# Patient Record
Sex: Male | Born: 1975 | Race: Black or African American | Hispanic: No | Marital: Single | Smoking: Current some day smoker
Health system: Southern US, Community
[De-identification: ages and names within clinical notes are randomized; demographics above are authoritative.]

## PROBLEM LIST (undated history)

## (undated) HISTORY — PX: CYSTOSCOPY: SUR368

---

## 2012-01-04 ENCOUNTER — Ambulatory Visit (INDEPENDENT_AMBULATORY_CARE_PROVIDER_SITE_OTHER): Payer: Managed Care, Other (non HMO) | Admitting: Family Medicine

## 2012-01-04 ENCOUNTER — Ambulatory Visit (HOSPITAL_BASED_OUTPATIENT_CLINIC_OR_DEPARTMENT_OTHER)
Admission: RE | Admit: 2012-01-04 | Discharge: 2012-01-04 | Disposition: A | Discharge status: Home / Self Care | Payer: Managed Care, Other (non HMO) | Source: Ambulatory Visit | Attending: Family Medicine | Admitting: Family Medicine

## 2012-01-04 ENCOUNTER — Encounter: Payer: Self-pay | Admitting: Family Medicine

## 2012-01-04 VITALS — BP 120/68 | HR 86 | Temp 98.0°F | Wt 185.0 lb

## 2012-01-04 DIAGNOSIS — M545 Low back pain, unspecified: Secondary | ICD-10-CM | POA: Insufficient documentation

## 2012-01-04 DIAGNOSIS — M5137 Other intervertebral disc degeneration, lumbosacral region: Secondary | ICD-10-CM | POA: Insufficient documentation

## 2012-01-04 DIAGNOSIS — M549 Dorsalgia, unspecified: Secondary | ICD-10-CM

## 2012-01-04 DIAGNOSIS — M51379 Other intervertebral disc degeneration, lumbosacral region without mention of lumbar back pain or lower extremity pain: Secondary | ICD-10-CM | POA: Insufficient documentation

## 2012-01-04 MED ORDER — HYDROCODONE-ACETAMINOPHEN 7.5-750 MG PO TABS: 1.0000 | ORAL_TABLET | Freq: Three times a day (TID) | ORAL | Status: DC | PRN

## 2012-01-04 MED ORDER — CYCLOBENZAPRINE HCL 10 MG PO TABS: 10.0000 mg | ORAL_TABLET | Freq: Three times a day (TID) | ORAL | Status: AC | PRN

## 2012-01-04 NOTE — Progress Notes (Signed)
  Subjective:    Colin Black is a 36 y.o. male who presents with right hip pain. Onset of the symptoms was about 4 weeks ago. Inciting event: none. The patient reports the hip pain is worse after period of inactivity and radiates to R ankle. Aggravating symptoms include: any weight bearing, rising after sitting, standing and walking. Patient has had no prior hip problems. Previous visits for this problem: none. Evaluation to date: none. Treatment to date: none.      Review of Systems Pertinent items are noted in HPI.   Objective:    BP 120/68  Pulse 86  Temp 98 F (36.7 C) (Oral)  Wt 185 lb (83.915 kg)  SpO2 98% Right hip: positives: decreased abduction, decreased flexion and tenderness over greater trochanter and negatives: + SLR R leg ,  dec DTR R patellar  Left hip: normal   Imaging: X-ray LS spine: not available    Assessment:    R hip pain-- suspect Lumbar etiology    Plan:    Natural history and expected course discussed. Questions answered. Transport planner distributed. X-rays per orders. flexeril and vicodin  Ice / heat rest

## 2012-01-04 NOTE — Patient Instructions (Addendum)
Back Pain, Adult Low back pain is very common. About 1 in 5 people have back pain.The cause of low back pain is rarely dangerous. The pain often gets better over time.About half of people with a sudden onset of back pain feel better in just 2 weeks. About 8 in 10 people feel better by 6 weeks.  CAUSES Some common causes of back pain include:  Strain of the muscles or ligaments supporting the spine.   Wear and tear (degeneration) of the spinal discs.   Arthritis.   Direct injury to the back.  DIAGNOSIS Most of the time, the direct cause of low back pain is not known.However, back pain can be treated effectively even when the exact cause of the pain is unknown.Answering your caregiver's questions about your overall health and symptoms is one of the most accurate ways to make sure the cause of your pain is not dangerous. If your caregiver needs more information, he or she may order lab work or imaging tests (X-rays or MRIs).However, even if imaging tests show changes in your back, this usually does not require surgery. HOME CARE INSTRUCTIONS For many people, back pain returns.Since low back pain is rarely dangerous, it is often a condition that people can learn to manageon their own.   Remain active. It is stressful on the back to sit or stand in one place. Do not sit, drive, or stand in one place for more than 30 minutes at a time. Take short walks on level surfaces as soon as pain allows.Try to increase the length of time you walk each day.   Do not stay in bed.Resting more than 1 or 2 days can delay your recovery.   Do not avoid exercise or work.Your body is made to move.It is not dangerous to be active, even though your back may hurt.Your back will likely heal faster if you return to being active before your pain is gone.   Pay attention to your body when you bend and lift. Many people have less discomfortwhen lifting if they bend their knees, keep the load close to their  bodies,and avoid twisting. Often, the most comfortable positions are those that put less stress on your recovering back.   Find a comfortable position to sleep. Use a firm mattress and lie on your side with your knees slightly bent. If you lie on your back, put a pillow under your knees.   Only take over-the-counter or prescription medicines as directed by your caregiver. Over-the-counter medicines to reduce pain and inflammation are often the most helpful.Your caregiver may prescribe muscle relaxant drugs.These medicines help dull your pain so you can more quickly return to your normal activities and healthy exercise.   Put ice on the injured area.   Put ice in a plastic bag.   Place a towel between your skin and the bag.   Leave the ice on for 15 to 20 minutes, 3 to 4 times a day for the first 2 to 3 days. After that, ice and heat may be alternated to reduce pain and spasms.   Ask your caregiver about trying back exercises and gentle massage. This may be of some benefit.   Avoid feeling anxious or stressed.Stress increases muscle tension and can worsen back pain.It is important to recognize when you are anxious or stressed and learn ways to manage it.Exercise is a great option.  SEEK MEDICAL CARE IF:  You have pain that is not relieved with rest or medicine.   You have   pain that does not improve in 1 week.   You have new symptoms.   You are generally not feeling well.  SEEK IMMEDIATE MEDICAL CARE IF:   You have pain that radiates from your back into your legs.   You develop new bowel or bladder control problems.   You have unusual weakness or numbness in your arms or legs.   You develop nausea or vomiting.   You develop abdominal pain.   You feel faint.  Document Released: 05/17/2005 Document Revised: 05/06/2011 Document Reviewed: 10/05/2010 [employer] Patient Information 2012 [employer]. 

## 2012-01-07 ENCOUNTER — Telehealth: Payer: Self-pay

## 2012-01-07 DIAGNOSIS — M5136 Other intervertebral disc degeneration, lumbar region: Secondary | ICD-10-CM

## 2012-01-07 DIAGNOSIS — M549 Dorsalgia, unspecified: Secondary | ICD-10-CM

## 2012-01-07 NOTE — Telephone Encounter (Signed)
Call from patient and he stated he wanted to proceed with the Physical Therapy due to no improvement in the back pain-- referral put in    KP

## 2012-01-14 ENCOUNTER — Telehealth: Payer: Self-pay

## 2012-01-14 DIAGNOSIS — M549 Dorsalgia, unspecified: Secondary | ICD-10-CM

## 2012-01-14 MED ORDER — HYDROCODONE-ACETAMINOPHEN 7.5-750 MG PO TABS: 1.0000 | ORAL_TABLET | Freq: Three times a day (TID) | ORAL | Status: AC | PRN

## 2012-01-14 NOTE — Telephone Encounter (Signed)
Which med?  If hydrocodone, ok for #30, no refills.  Any additional meds will require appt

## 2012-01-14 NOTE — Telephone Encounter (Signed)
Last seen and filled 01/04/12 # 30. Please advise     KP

## 2012-01-18 ENCOUNTER — Ambulatory Visit: Payer: Self-pay | Admitting: Family Medicine

## 2012-01-25 ENCOUNTER — Ambulatory Visit: Payer: Managed Care, Other (non HMO) | Attending: Family Medicine | Admitting: Physical Therapy

## 2012-01-25 DIAGNOSIS — IMO0001 Reserved for inherently not codable concepts without codable children: Secondary | ICD-10-CM | POA: Insufficient documentation

## 2012-01-25 DIAGNOSIS — M2569 Stiffness of other specified joint, not elsewhere classified: Secondary | ICD-10-CM | POA: Insufficient documentation

## 2012-01-25 DIAGNOSIS — M545 Low back pain, unspecified: Secondary | ICD-10-CM | POA: Insufficient documentation

## 2012-02-08 ENCOUNTER — Ambulatory Visit: Payer: Managed Care, Other (non HMO) | Attending: Family Medicine | Admitting: Physical Therapy

## 2012-02-08 DIAGNOSIS — M545 Low back pain, unspecified: Secondary | ICD-10-CM | POA: Insufficient documentation

## 2012-02-08 DIAGNOSIS — IMO0001 Reserved for inherently not codable concepts without codable children: Secondary | ICD-10-CM | POA: Insufficient documentation

## 2012-02-08 DIAGNOSIS — M2569 Stiffness of other specified joint, not elsewhere classified: Secondary | ICD-10-CM | POA: Insufficient documentation

## 2012-02-15 ENCOUNTER — Ambulatory Visit: Payer: Managed Care, Other (non HMO) | Admitting: Physical Therapy

## 2012-02-22 ENCOUNTER — Ambulatory Visit: Payer: Managed Care, Other (non HMO) | Admitting: Physical Therapy

## 2012-02-29 ENCOUNTER — Ambulatory Visit: Payer: Managed Care, Other (non HMO) | Attending: Family Medicine | Admitting: Physical Therapy

## 2012-02-29 DIAGNOSIS — IMO0001 Reserved for inherently not codable concepts without codable children: Secondary | ICD-10-CM | POA: Insufficient documentation

## 2012-02-29 DIAGNOSIS — M545 Low back pain, unspecified: Secondary | ICD-10-CM | POA: Insufficient documentation

## 2012-02-29 DIAGNOSIS — M2569 Stiffness of other specified joint, not elsewhere classified: Secondary | ICD-10-CM | POA: Insufficient documentation

## 2012-03-07 ENCOUNTER — Ambulatory Visit: Payer: Managed Care, Other (non HMO) | Admitting: Physical Therapy

## 2012-03-13 ENCOUNTER — Other Ambulatory Visit: Payer: Self-pay

## 2012-03-13 MED ORDER — HYDROCODONE-ACETAMINOPHEN 7.5-750 MG PO TABS: 1.0000 | ORAL_TABLET | Freq: Four times a day (QID) | ORAL | Status: DC | PRN

## 2012-03-13 NOTE — Telephone Encounter (Signed)
Last seen 01/04/12 and filled #30 no refills.  PLz advise   MW

## 2012-03-13 NOTE — Telephone Encounter (Signed)
Ok to refill x1 but then he will need ov if no better

## 2012-03-14 ENCOUNTER — Encounter: Payer: Managed Care, Other (non HMO) | Admitting: Physical Therapy

## 2012-03-14 ENCOUNTER — Ambulatory Visit: Payer: Managed Care, Other (non HMO) | Admitting: Physical Therapy

## 2012-03-21 ENCOUNTER — Encounter: Payer: Managed Care, Other (non HMO) | Admitting: Physical Therapy

## 2012-03-21 ENCOUNTER — Ambulatory Visit: Payer: Managed Care, Other (non HMO) | Admitting: Physical Therapy

## 2012-03-28 ENCOUNTER — Encounter: Payer: Managed Care, Other (non HMO) | Admitting: Physical Therapy

## 2012-05-09 ENCOUNTER — Telehealth: Payer: Self-pay

## 2012-05-09 ENCOUNTER — Encounter: Payer: Self-pay | Admitting: Family Medicine

## 2012-05-09 DIAGNOSIS — M549 Dorsalgia, unspecified: Secondary | ICD-10-CM | POA: Insufficient documentation

## 2012-05-09 NOTE — Telephone Encounter (Signed)
Patient aware paper work ready for pick up.       KP 

## 2012-05-09 NOTE — Telephone Encounter (Signed)
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  I;m catching up this week.

## 2012-05-09 NOTE — Telephone Encounter (Signed)
Patient dropped off paperwork while you were out and he is calling to check the status. Please advise     KP

## 2012-06-22 ENCOUNTER — Encounter: Payer: Self-pay | Admitting: Lab

## 2012-06-23 ENCOUNTER — Encounter: Payer: Self-pay | Admitting: Family Medicine

## 2012-06-23 ENCOUNTER — Ambulatory Visit (INDEPENDENT_AMBULATORY_CARE_PROVIDER_SITE_OTHER): Payer: Managed Care, Other (non HMO) | Admitting: Family Medicine

## 2012-06-23 VITALS — BP 110/64 | HR 105 | Temp 98.4°F | Wt 195.2 lb

## 2012-06-23 DIAGNOSIS — R350 Frequency of micturition: Secondary | ICD-10-CM

## 2012-06-23 LAB — POCT URINALYSIS DIPSTICK
Blood, UA: NEGATIVE
Nitrite, UA: NEGATIVE
Protein, UA: NEGATIVE
Spec Grav, UA: 1.015
Urobilinogen, UA: >=8

## 2012-06-23 NOTE — Patient Instructions (Signed)
Urinary Frequency The number of times a normal person urinates depends upon how much liquid they take in and how much liquid they are losing. If the temperature is hot and there is high humidity then the person will sweat more and usually breathe a little more frequently. These factors decrease the amount of frequency of urination that would be considered normal. The amount you drink is easily determined, but the amount of fluid lost is sometimes more difficult to calculate.  Fluid is lost in two ways:  Sensible fluid loss is usually measured by the amount of urine that you get rid of. Losses of fluid can also occur with diarrhea.  Insensible fluid loss is more difficult to measure. It is caused by evaporation. Insensible loss of fluid occurs through breathing and sweating. It usually ranges from a little less than a quart to a little more than a quart of fluid a day. In normal temperatures and activity levels the average person may urinate 4 to 7 times in a 24-hour period. Needing to urinate more often than that could indicate a problem. If one urinates 4 to 7 times in 24 hours and has large volumes each time, that could indicate a different problem from one who urinates 4 to 7 times a day and has small volumes. The time of urinating is also an important. Most urinating should be done during the waking hours. Getting up at night to urinate frequently can indicate some problems. CAUSES  The bladder is the organ in your lower abdomen that holds urine. Like a balloon, it swells some as it fills up. Your nerves sense this and tell you it is time to head for the bathroom. There are a number of reasons that you might feel the need to urinate more often than usual. They include:  Urinary tract infection. This is usually associated with other signs such as burning when you urinate.  In men, problems with the prostate (a walnut-size gland that is located near the tube that carries urine out of your body).  There are two reasons why the prostate can cause an increased frequency of urination:  An enlarged prostate that does not let the bladder empty well. If the bladder only half empties when you urinate then it only has half the capacity to fill before you have to urinate again.  The nerves in the bladder become more hypersensitive with an increased size of the prostate even if the bladder empties completely.  Pregnancy.  Obesity. Excess weight is more likely to cause a problem for women more than for men.  Bladder stones or other bladder problems.  Caffeine.  Alcohol.  Medications. For example, drugs that help the body get rid of extra fluid (diuretics) increase urine production. Some other medicines must be taken with lots of fluids.  Muscle or nerve weakness. This might be the result of a spinal cord injury, a stroke, multiple sclerosis or Parkinson's disease.  Long-standing diabetes can decrease the sensation of the bladder. This loss of sensation makes it harder to sense the bladder needs to be emptied. Over a period of years the bladder is stretched out by constant overfilling. This weakens the bladder muscles so that the bladder does not empty well and has less capacity to fill with new urine.  Interstitial cystitis (also called painful bladder syndrome). This condition develops because the tissues that line the insider of the bladder are inflamed (inflammation is the body's way of reacting to injury or infection). It causes pain  the insider of the bladder are inflamed (inflammation is the body's way of reacting to injury or infection). It causes pain and frequent urination. It occurs in women more often than in men.  DIAGNOSIS    To decide what might be causing your urinary frequency, your healthcare provider will probably:   Ask about symptoms you have noticed.   Ask about your overall health. This will include questions about any medications you are taking.   Do a physical examination.   Order some tests. These might include:   A blood test to check for diabetes or other health issues that could be  contributing to the problem.   Urine testing. This could measure the flow of urine and the pressure on the bladder.   A test of your neurological system (the brain, spinal cord and nerves). This is the system that senses the need to urinate.   A bladder test to check whether it is emptying completely when you urinate.   Cytoscopy. This test uses a thin tube with a tiny camera on it. It offers a look inside your urethra and bladder to see if there are problems.   Imaging tests. You might be given a contrast dye and then asked to urinate. X-rays are taken to see how your bladder is working.  TREATMENT   It is important for you to be evaluated to determine if the amount or frequency that you have is unusual or abnormal. If it is found to be abnormal the cause should be determined and this can usually be found out easily. Depending upon the cause treatment could include medication, stimulation of the nerves, or surgery.  There are not too many things that you can do as an individual to change your urinary frequency. It is important that you balance the amount of fluid intake needed to compensate for your activity and the temperature. Medical problems will be diagnosed and taken care of by your physician. There is no particular bladder training such as Kegel's exercises that you can do to help urinary frequency. This is an exercise this is usually done for people who have leaking of urine when they laugh cough or sneeze.  HOME CARE INSTRUCTIONS    Take any medications your healthcare provider prescribed or suggested. Follow the directions carefully.   Practice any lifestyle changes that are recommended. These might include:   Drinking less fluid or drinking at different times of the day. If you need to urinate often during the night, for example, you may need to stop drinking fluids early in the evening.   Cutting down on caffeine or alcohol. They both can make you need to urinate more often than normal.  Caffeine is found in coffee, tea and sodas.   Losing weight, if that is recommended.   Keep a journal or a log. You might be asked to record how much you drink and when and when you feel the need to urinate. This will also help evaluate how well the treatment provided by your physician is working.  SEEK MEDICAL CARE IF:    Your need to urinate often gets worse.   You feel increased pain or irritation when you urinate.   You notice blood in your urine.   You have questions about any medications that your healthcare provider recommended.   You notice blood, pus or swelling at the site of any test or treatment procedure.   You develop a fever of more than 100.5 F (38.1 C).  SEEK

## 2012-06-23 NOTE — Progress Notes (Signed)
  Subjective:    Colin Black is a 37 y.o. male who complains of frequency. He has had symptoms for 1 month. Patient also complains of none. Patient denies back pain, congestion, cough, fever, headache, rhinitis, sorethroat, stomach ache and vaginal discharge. Patient does have a history of recurrent UTI. Patient does have a history of pyelonephritis. Pt saw urology and had a cytoscopy about 5 years ago.    The following portions of the patient's history were reviewed and updated as appropriate: allergies, current medications, past family history, past medical history, past social history, past surgical history and problem list.  Review of Systems Pertinent items are noted in HPI.    Objective:    BP 110/64  Pulse 105  Temp 98.4 F (36.9 C) (Oral)  Wt 195 lb 3.2 oz (88.542 kg)  SpO2 98% General appearance: alert, cooperative, appears stated age and no distress  Laboratory:  Urine dipstick: trace for leukocyte esterase.   Micro exam: not done.    Assessment:    urinary frequency     Plan:    Medications: not indicated at this time. Maintain adequate hydration. Follow up if symptoms not improving, and as needed.

## 2012-06-25 LAB — URINE CULTURE: Colony Count: NO GROWTH

## 2012-07-15 ENCOUNTER — Other Ambulatory Visit: Payer: Self-pay

## 2012-09-06 ENCOUNTER — Telehealth: Payer: Self-pay

## 2012-09-06 MED ORDER — NICOTINE 21 MG/24HR TD PT24: 1.0000 | MEDICATED_PATCH | TRANSDERMAL | Status: DC

## 2012-09-06 NOTE — Telephone Encounter (Signed)
Its otc--- he doesn't need an rx  Start with 21 mg for at least 6 weeks Then decrease to 14 for 2 weeks (or longer if needed)  Then 7 mg for 2 weeks

## 2012-09-06 NOTE — Telephone Encounter (Signed)
Call from patient requesting an Rx for Nicoderm CQ, he said he is ready to quit smoking. Please advise     KP

## 2012-09-06 NOTE — Telephone Encounter (Addendum)
Patient is using his flexible spending account, Rx for the 21 mg sent to [facility] on [address]. He will call in 6 weeks for the 14 mg.      KP

## 2012-10-30 ENCOUNTER — Telehealth: Payer: Self-pay

## 2012-10-30 MED ORDER — HYDROCODONE-ACETAMINOPHEN 7.5-750 MG PO TABS: 1.0000 | ORAL_TABLET | Freq: Four times a day (QID) | ORAL | Status: DC | PRN

## 2012-10-30 NOTE — Telephone Encounter (Signed)
Refill x1 

## 2012-10-30 NOTE — Telephone Encounter (Signed)
Last seen 06/23/12 and filled 03/13/12 #30. Patient takes Prn.  Please advise    KP

## 2013-01-18 ENCOUNTER — Emergency Department (HOSPITAL_COMMUNITY)
Admission: EM | Admit: 2013-01-18 | Discharge: 2013-01-19 | Disposition: A | Discharge status: Home / Self Care | Payer: Managed Care, Other (non HMO) | Attending: Emergency Medicine | Admitting: Emergency Medicine

## 2013-01-18 ENCOUNTER — Encounter (HOSPITAL_COMMUNITY): Payer: Self-pay | Admitting: Emergency Medicine

## 2013-01-18 DIAGNOSIS — S0501XA Injury of conjunctiva and corneal abrasion without foreign body, right eye, initial encounter: Secondary | ICD-10-CM

## 2013-01-18 DIAGNOSIS — S058X9A Other injuries of unspecified eye and orbit, initial encounter: Secondary | ICD-10-CM | POA: Insufficient documentation

## 2013-01-18 DIAGNOSIS — Y9389 Activity, other specified: Secondary | ICD-10-CM | POA: Insufficient documentation

## 2013-01-18 DIAGNOSIS — IMO0002 Reserved for concepts with insufficient information to code with codable children: Secondary | ICD-10-CM | POA: Insufficient documentation

## 2013-01-18 DIAGNOSIS — Z79899 Other long term (current) drug therapy: Secondary | ICD-10-CM | POA: Insufficient documentation

## 2013-01-18 DIAGNOSIS — Y990 Civilian activity done for income or pay: Secondary | ICD-10-CM | POA: Insufficient documentation

## 2013-01-18 DIAGNOSIS — F17200 Nicotine dependence, unspecified, uncomplicated: Secondary | ICD-10-CM | POA: Insufficient documentation

## 2013-01-18 DIAGNOSIS — Y929 Unspecified place or not applicable: Secondary | ICD-10-CM | POA: Insufficient documentation

## 2013-01-18 NOTE — ED Notes (Addendum)
Pt's right eye is very irritated and watery. Pt reports pain in his right eye especially when he raises his eye lid. Pt states he flushed his right eye sixty times and did not get any relief. Pt states the air conditioner in the car really hurt and irriatated his right eye.

## 2013-01-18 NOTE — ED Notes (Signed)
PT. REPORTS METAL CHIP HIT HIS RIGHT EYE WHILE RESURFACING A BRAKE ROTOR TODAY , PRESENTS WITH RED TEARY RIGHT EYE. NO BLURRED VISION .

## 2013-01-19 MED ORDER — FLUORESCEIN SODIUM 1 MG OP STRP
1.0000 | ORAL_STRIP | Freq: Once | OPHTHALMIC | Status: AC
  Administered 2013-01-19: 1 via OPHTHALMIC
  Filled 2013-01-19: qty 1

## 2013-01-19 MED ORDER — TETRACAINE HCL 0.5 % OP SOLN
1.0000 [drp] | Freq: Once | OPHTHALMIC | Status: AC
  Administered 2013-01-19: 1 [drp] via OPHTHALMIC
  Filled 2013-01-19: qty 2

## 2013-01-19 MED ORDER — HYDROCODONE-ACETAMINOPHEN 5-325 MG PO TABS: 1.0000 | ORAL_TABLET | ORAL | Status: DC | PRN

## 2013-01-19 MED ORDER — ERYTHROMYCIN 5 MG/GM OP OINT: TOPICAL_OINTMENT | OPHTHALMIC | Status: DC

## 2013-01-19 NOTE — ED Provider Notes (Signed)
  CSN: [id number]     Arrival date & time 01/18/13  2227 History     None    Chief Complaint  Patient presents with  . Foreign Body in Eye   (Consider location/radiation/quality/duration/timing/severity/associated sxs/prior Treatment) HPI History provided by pt.   Pt presents w/ right eye pain and foreign body sensation.  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  Flushed eye 6 times w/out relief. Symptoms aggravated by blinking and associated w/ lacrimation.  Denies blurred vision, drainage and fever.  Does not wear corrective lenses. Tetanus up to date. History reviewed. No pertinent past medical history. Past Surgical History  Procedure Laterality Date  . Cystoscopy      remove scar tissue from urinary tract   Family History  Problem Relation Age of Onset  . Arthritis Maternal Grandfather   . Prostate cancer Maternal Grandfather   . Hypertension Mother   . Hypertension Maternal Grandmother   . Diabetes Father   . Diabetes Maternal Grandmother   . Diabetes Maternal Uncle    History  Substance Use Topics  . Smoking status: Current Every Day Smoker  . Smokeless tobacco: Not on file  . Alcohol Use: Yes    Review of Systems  All other systems reviewed and are negative.    Allergies  Review of patient's allergies indicates no known allergies.  Home Medications   Current Outpatient Rx  Name  Route  Sig  Dispense  Refill  . Multiple Vitamin (MULTIVITAMIN) tablet   Oral   Take 1 tablet by mouth daily.         . Naproxen Sodium (ALEVE PO)   Oral   Take 1 tablet by mouth daily as needed (pain).         Marland Kitchen erythromycin ophthalmic ointment      Place a 1/2 inch ribbon of ointment into the lower eyelid.   1 g   0   . HYDROcodone-acetaminophen (NORCO/VICODIN) 5-325 MG per tablet   Oral   Take 1 tablet by mouth every 4 (four) hours as needed for pain.   12 tablet   0    BP 131/92  Pulse 72  Temp(Src) 97.1 F (36.2 C)  (Oral)  Resp 17  SpO2 98% Physical Exam  Nursing note and vitals reviewed. Constitutional: He is oriented to person, place, and time. He appears well-developed and well-nourished. No distress.  HENT:  Head: Normocephalic and atraumatic.  Nml conjunctiva on R.  PERRL.  EOMi.  Pinpoint corneal abrasion at 7 o-clock is visible w/ naked eye.  No other visible abrasions when stained w/ fluorescein and visualized under black light.  No foreign body under eyelids.  Eyes:  Normal appearance  Neck: Normal range of motion.  Pulmonary/Chest: Effort normal.  Musculoskeletal: Normal range of motion.  Neurological: He is alert and oriented to person, place, and time.  Psychiatric: He has a normal mood and affect. His behavior is normal.    ED Course   Procedures (including critical care time)  Labs Reviewed - No data to display No results found. 1. Corneal abrasion, right, initial encounter     MDM  37yo M presents w/ foreign body sensation R eye.  Exam consistent w/ corneal abrasion.  He does not wear corrective lenses.  Tetanus up to date. Prescribed erythromycin ointment and vicodin.  Return precautions discussed.   Otilio Miu, PA-C 01/19/13 [phone]

## 2013-01-20 NOTE — ED Provider Notes (Signed)
Medical screening examination/treatment/procedure(s) were performed by non-physician practitioner and as supervising physician I was immediately available for consultation/collaboration.   Gavin Pound. Sharlyn Odonnel, MD 01/20/13 1610

## 2013-03-21 ENCOUNTER — Encounter: Payer: Self-pay | Admitting: Family Medicine

## 2013-03-21 ENCOUNTER — Ambulatory Visit (INDEPENDENT_AMBULATORY_CARE_PROVIDER_SITE_OTHER): Payer: Managed Care, Other (non HMO) | Admitting: Family Medicine

## 2013-03-21 VITALS — BP 134/82 | HR 81 | Temp 98.5°F | Wt 203.0 lb

## 2013-03-21 DIAGNOSIS — R350 Frequency of micturition: Secondary | ICD-10-CM

## 2013-03-21 LAB — POCT URINALYSIS DIPSTICK
Glucose, UA: NEGATIVE
Ketones, UA: NEGATIVE
Protein, UA: NEGATIVE
Spec Grav, UA: 1.01

## 2013-03-21 NOTE — Progress Notes (Signed)
  Subjective:    Colin Black is a 37 y.o. male who complains of frequency. He has had symptoms for several days. Patient also complains of more frequent cath. Patient denies back pain, congestion, cough, fever, headache, rhinitis, sorethroat and stomach ache. Patient does not have a history of recurrent UTI. Patient does not have a history of pyelonephritis. 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    The following portions of the patient's history were reviewed and updated as appropriate: allergies, current medications, past family history, past medical history, past social history, past surgical history and problem list.  Review of Systems Pertinent items are noted in HPI.    Objective:    BP 134/82  Pulse 81  Temp(Src) 98.5 F (36.9 C) (Oral)  Wt 203 lb (92.08 kg)  SpO2 98% General appearance: alert, cooperative, appears stated age and no distress Abdomen: soft, non-tender; bowel sounds normal; no masses,  no organomegaly  Laboratory:  Urine dipstick: trace for leukocyte esterase.  --all else neg Micro exam: not done.    Assessment:    urinary frequency     Plan:    Maintain adequate hydration. Follow up if symptoms not improving, and as needed. refer to urology

## 2013-03-21 NOTE — Patient Instructions (Signed)
Urinary Frequency °The number of times a normal person urinates depends upon how much liquid they take in and how much liquid they are losing. If the temperature is hot and there is high humidity then the person will sweat more and usually breathe a little more frequently. These factors decrease the amount of frequency of urination that would be considered normal. °The amount you drink is easily determined, but the amount of fluid lost is sometimes more difficult to calculate.  °Fluid is lost in two ways: °· Sensible fluid loss is usually measured by the amount of urine that you get rid of. Losses of fluid can also occur with diarrhea. °· Insensible fluid loss is more difficult to measure. It is caused by evaporation. Insensible loss of fluid occurs through breathing and sweating. It usually ranges from a little less than a quart to a little more than a quart of fluid a day. °In normal temperatures and activity levels the average person may urinate 4 to 7 times in a 24-hour period. Needing to urinate more often than that could indicate a problem. If one urinates 4 to 7 times in 24 hours and has large volumes each time, that could indicate a different problem from one who urinates 4 to 7 times a day and has small volumes. The time of urinating is also an important. Most urinating should be done during the waking hours. Getting up at night to urinate frequently can indicate some problems. °CAUSES  °The bladder is the organ in your lower abdomen that holds urine. Like a balloon, it swells some as it fills up. Your nerves sense this and tell you it is time to head for the bathroom. There are a number of reasons that you might feel the need to urinate more often than usual. They include: °· Urinary tract infection. This is usually associated with other signs such as burning when you urinate. °· In men, problems with the prostate (a walnut-size gland that is located near the tube that carries urine out of your body).  There are two reasons why the prostate can cause an increased frequency of urination: °· An enlarged prostate that does not let the bladder empty well. If the bladder only half empties when you urinate then it only has half the capacity to fill before you have to urinate again. °· The nerves in the bladder become more hypersensitive with an increased size of the prostate even if the bladder empties completely. °· Pregnancy. °· Obesity. Excess weight is more likely to cause a problem for women more than for men. °· Bladder stones or other bladder problems. °· Caffeine. °· Alcohol. °· Medications. For example, drugs that help the body get rid of extra fluid (diuretics) increase urine production. Some other medicines must be taken with lots of fluids. °· Muscle or nerve weakness. This might be the result of a spinal cord injury, a stroke, multiple sclerosis or Parkinson's disease. °· Long-standing diabetes can decrease the sensation of the bladder. This loss of sensation makes it harder to sense the bladder needs to be emptied. Over a period of years the bladder is stretched out by constant overfilling. This weakens the bladder muscles so that the bladder does not empty well and has less capacity to fill with new urine. °· Interstitial cystitis (also called painful bladder syndrome). This condition develops because the tissues that line the insider of the bladder are inflamed (inflammation is the body's way of reacting to injury or infection). It causes pain   and frequent urination. It occurs in women more often than in men. °DIAGNOSIS  °· To decide what might be causing your urinary frequency, your healthcare provider will probably: °· Ask about symptoms you have noticed. °· Ask about your overall health. This will include questions about any medications you are taking. °· Do a physical examination. °· Order some tests. These might include: °· A blood test to check for diabetes or other health issues that could be  contributing to the problem. °· Urine testing. This could measure the flow of urine and the pressure on the bladder. °· A test of your neurological system (the brain, spinal cord and nerves). This is the system that senses the need to urinate. °· A bladder test to check whether it is emptying completely when you urinate. °· Cytoscopy. This test uses a thin tube with a tiny camera on it. It offers a look inside your urethra and bladder to see if there are problems. °· Imaging tests. You might be given a contrast dye and then asked to urinate. X-rays are taken to see how your bladder is working. °TREATMENT  °It is important for you to be evaluated to determine if the amount or frequency that you have is unusual or abnormal. If it is found to be abnormal the cause should be determined and this can usually be found out easily. Depending upon the cause treatment could include medication, stimulation of the nerves, or surgery. °There are not too many things that you can do as an individual to change your urinary frequency. It is important that you balance the amount of fluid intake needed to compensate for your activity and the temperature. Medical problems will be diagnosed and taken care of by your physician. There is no particular bladder training such as Kegel's exercises that you can do to help urinary frequency. This is an exercise this is usually done for people who have leaking of urine when they laugh cough or sneeze. °HOME CARE INSTRUCTIONS  °· Take any medications your healthcare provider prescribed or suggested. Follow the directions carefully. °· Practice any lifestyle changes that are recommended. These might include: °· Drinking less fluid or drinking at different times of the day. If you need to urinate often during the night, for example, you may need to stop drinking fluids early in the evening. °· Cutting down on caffeine or alcohol. They both can make you need to urinate more often than normal.  Caffeine is found in coffee, tea and sodas. °· Losing weight, if that is recommended. °· Keep a journal or a log. You might be asked to record how much you drink and when and when you feel the need to urinate. This will also help evaluate how well the treatment provided by your physician is working. °SEEK MEDICAL CARE IF:  °· Your need to urinate often gets worse. °· You feel increased pain or irritation when you urinate. °· You notice blood in your urine. °· You have questions about any medications that your healthcare provider recommended. °· You notice blood, pus or swelling at the site of any test or treatment procedure. °· You develop a fever of more than 100.5° F (38.1° C). °SEEK IMMEDIATE MEDICAL CARE IF:  °You develop a fever of more than 102.0° F (38.9° C). °Document Released: 03/13/2009 Document Revised: 08/09/2011 Document Reviewed: 03/13/2009 °[employer]® Patient Information ©2014 [employer]. ° °

## 2013-03-23 LAB — URINE CULTURE
Colony Count: NO GROWTH
Organism ID, Bacteria: NO GROWTH

## 2013-04-05 ENCOUNTER — Other Ambulatory Visit: Payer: Self-pay

## 2014-02-12 DIAGNOSIS — Z8042 Family history of malignant neoplasm of prostate: Secondary | ICD-10-CM | POA: Insufficient documentation

## 2014-02-12 DIAGNOSIS — F17200 Nicotine dependence, unspecified, uncomplicated: Secondary | ICD-10-CM | POA: Insufficient documentation

## 2014-02-12 DIAGNOSIS — N35919 Unspecified urethral stricture, male, unspecified site: Secondary | ICD-10-CM | POA: Insufficient documentation

## 2014-11-17 ENCOUNTER — Encounter (HOSPITAL_COMMUNITY): Payer: Self-pay | Admitting: Emergency Medicine

## 2014-11-17 ENCOUNTER — Emergency Department (HOSPITAL_COMMUNITY): Payer: Managed Care, Other (non HMO)

## 2014-11-17 ENCOUNTER — Emergency Department (HOSPITAL_COMMUNITY)
Admission: EM | Admit: 2014-11-17 | Discharge: 2014-11-17 | Disposition: A | Discharge status: Home / Self Care | Payer: Managed Care, Other (non HMO) | Attending: Emergency Medicine | Admitting: Emergency Medicine

## 2014-11-17 DIAGNOSIS — R112 Nausea with vomiting, unspecified: Secondary | ICD-10-CM | POA: Insufficient documentation

## 2014-11-17 DIAGNOSIS — R5383 Other fatigue: Secondary | ICD-10-CM | POA: Diagnosis not present

## 2014-11-17 DIAGNOSIS — Z720 Tobacco use: Secondary | ICD-10-CM | POA: Insufficient documentation

## 2014-11-17 DIAGNOSIS — R109 Unspecified abdominal pain: Secondary | ICD-10-CM

## 2014-11-17 DIAGNOSIS — R42 Dizziness and giddiness: Secondary | ICD-10-CM | POA: Insufficient documentation

## 2014-11-17 LAB — CBC WITH DIFFERENTIAL/PLATELET
BASOS ABS: 0 10*3/uL (ref 0.0–0.1)
Basophils Relative: 0 % (ref 0–1)
Eosinophils Absolute: 0.4 10*3/uL (ref 0.0–0.7)
Eosinophils Relative: 5 % (ref 0–5)
HCT: 42.1 % (ref 39.0–52.0)
Hemoglobin: 15.1 g/dL (ref 13.0–17.0)
Lymphocytes Relative: 24 % (ref 12–46)
Lymphs Abs: 1.9 10*3/uL (ref 0.7–4.0)
MCH: 33.6 pg (ref 26.0–34.0)
MCHC: 35.9 g/dL (ref 30.0–36.0)
MCV: 93.8 fL (ref 78.0–100.0)
Monocytes Absolute: 0.8 10*3/uL (ref 0.1–1.0)
Monocytes Relative: 11 % (ref 3–12)
Neutro Abs: 4.6 10*3/uL (ref 1.7–7.7)
Neutrophils Relative %: 60 % (ref 43–77)
Platelets: 229 10*3/uL (ref 150–400)
RBC: 4.49 MIL/uL (ref 4.22–5.81)
RDW: 11.7 % (ref 11.5–15.5)
WBC: 7.8 10*3/uL (ref 4.0–10.5)

## 2014-11-17 LAB — COMPREHENSIVE METABOLIC PANEL
ALBUMIN: 4.1 g/dL (ref 3.5–5.0)
ALT: 19 U/L (ref 17–63)
AST: 25 U/L (ref 15–41)
Alkaline Phosphatase: 58 U/L (ref 38–126)
Anion gap: 7 (ref 5–15)
BUN: 7 mg/dL (ref 6–20)
CALCIUM: 9 mg/dL (ref 8.9–10.3)
CHLORIDE: 104 mmol/L (ref 101–111)
CO2: 26 mmol/L (ref 22–32)
Creatinine, Ser: 1.15 mg/dL (ref 0.61–1.24)
GFR calc non Af Amer: >60 mL/min (ref >60)
GLUCOSE: 92 mg/dL (ref 65–99)
Potassium: 3.8 mmol/L (ref 3.5–5.1)
Sodium: 137 mmol/L (ref 135–145)
Total Bilirubin: 0.9 mg/dL (ref 0.3–1.2)
Total Protein: 7.4 g/dL (ref 6.5–8.1)

## 2014-11-17 LAB — LIPASE, BLOOD: LIPASE: 18 U/L — AB (ref 22–51)

## 2014-11-17 MED ORDER — ONDANSETRON 8 MG PO TBDP: 8.0000 mg | ORAL_TABLET | Freq: Three times a day (TID) | ORAL | Status: AC | PRN

## 2014-11-17 MED ORDER — SODIUM CHLORIDE 0.9 % IV BOLUS (SEPSIS)
1000.0000 mL | Freq: Once | INTRAVENOUS | Status: AC
  Administered 2014-11-17: 1000 mL via INTRAVENOUS

## 2014-11-17 MED ORDER — ONDANSETRON HCL 4 MG/2ML IJ SOLN
4.0000 mg | Freq: Once | INTRAMUSCULAR | Status: AC
  Administered 2014-11-17: 4 mg via INTRAVENOUS
  Filled 2014-11-17: qty 2

## 2014-11-17 NOTE — Discharge Instructions (Signed)
Abdominal Pain °Many things can cause abdominal pain. Usually, abdominal pain is not caused by a disease and will improve without treatment. It can often be observed and treated at home. Your health care provider will do a physical exam and possibly order blood tests and X-rays to help determine the seriousness of your pain. However, in many cases, more time must pass before a clear cause of the pain can be found. Before that point, your health care provider may not know if you need more testing or further treatment. °HOME CARE INSTRUCTIONS  °Monitor your abdominal pain for any changes. The following actions may help to alleviate any discomfort you are experiencing: °· Only take over-the-counter or prescription medicines as directed by your health care provider. °· Do not take laxatives unless directed to do so by your health care provider. °· Try a clear liquid diet (broth, tea, or water) as directed by your health care provider. Slowly move to a bland diet as tolerated. °SEEK MEDICAL CARE IF: °· You have unexplained abdominal pain. °· You have abdominal pain associated with nausea or diarrhea. °· You have pain when you urinate or have a bowel movement. °· You experience abdominal pain that wakes you in the night. °· You have abdominal pain that is worsened or improved by eating food. °· You have abdominal pain that is worsened with eating fatty foods. °· You have a fever. °SEEK IMMEDIATE MEDICAL CARE IF:  °· Your pain does not go away within 2 hours. °· You keep throwing up (vomiting). °· Your pain is felt only in portions of the abdomen, such as the right side or the left lower portion of the abdomen. °· You pass bloody or black tarry stools. °MAKE SURE YOU: °· Understand these instructions.   °· Will watch your condition.   °· Will get help right away if you are not doing well or get worse.   °Document Released: 02/24/2005 Document Revised: 05/22/2013 Document Reviewed: 01/24/2013 °[employer]® Patient Information  ©2015 [employer]. This information is not intended to replace advice given to you by your health care provider. Make sure you discuss any questions you have with your health care provider. ° °Nausea and Vomiting °Nausea is a sick feeling that often comes before throwing up (vomiting). Vomiting is a reflex where stomach contents come out of your mouth. Vomiting can cause severe loss of body fluids (dehydration). Children and elderly adults can become dehydrated quickly, especially if they also have diarrhea. Nausea and vomiting are symptoms of a condition or disease. It is important to find the cause of your symptoms. °CAUSES  °· Direct irritation of the stomach lining. This irritation can result from increased acid production (gastroesophageal reflux disease), infection, food poisoning, taking certain medicines (such as nonsteroidal anti-inflammatory drugs), alcohol use, or tobacco use. °· Signals from the brain. These signals could be caused by a headache, heat exposure, an inner ear disturbance, increased pressure in the brain from injury, infection, a tumor, or a concussion, pain, emotional stimulus, or metabolic problems. °· An obstruction in the gastrointestinal tract (bowel obstruction). °· Illnesses such as diabetes, hepatitis, gallbladder problems, appendicitis, kidney problems, cancer, sepsis, atypical symptoms of a heart attack, or eating disorders. °· Medical treatments such as chemotherapy and radiation. °· Receiving medicine that makes you sleep (general anesthetic) during surgery. °DIAGNOSIS °Your caregiver may ask for tests to be done if the problems do not improve after a few days. Tests may also be done if symptoms are severe or if the reason for the nausea   and vomiting is not clear. Tests may include: °· Urine tests. °· Blood tests. °· Stool tests. °· Cultures (to look for evidence of infection). °· X-rays or other imaging studies. °Test results can help your caregiver make decisions about  treatment or the need for additional tests. °TREATMENT °You need to stay well hydrated. Drink frequently but in small amounts. You may wish to drink water, sports drinks, clear broth, or eat frozen ice pops or gelatin dessert to help stay hydrated. When you eat, eating slowly may help prevent nausea. There are also some antinausea medicines that may help prevent nausea. °HOME CARE INSTRUCTIONS  °· Take all medicine as directed by your caregiver. °· If you do not have an appetite, do not force yourself to eat. However, you must continue to drink fluids. °· If you have an appetite, eat a normal diet unless your caregiver tells you differently. °¨ Eat a variety of complex carbohydrates (rice, wheat, potatoes, bread), lean meats, yogurt, fruits, and vegetables. °¨ Avoid high-fat foods because they are more difficult to digest. °· Drink enough water and fluids to keep your urine clear or pale yellow. °· If you are dehydrated, ask your caregiver for specific rehydration instructions. Signs of dehydration may include: °¨ Severe thirst. °¨ Dry lips and mouth. °¨ Dizziness. °¨ Dark urine. °¨ Decreasing urine frequency and amount. °¨ Confusion. °¨ Rapid breathing or pulse. °SEEK IMMEDIATE MEDICAL CARE IF:  °· You have blood or brown flecks (like coffee grounds) in your vomit. °· You have black or bloody stools. °· You have a severe headache or stiff neck. °· You are confused. °· You have severe abdominal pain. °· You have chest pain or trouble breathing. °· You do not urinate at least once every 8 hours. °· You develop cold or clammy skin. °· You continue to vomit for longer than 24 to 48 hours. °· You have a fever. °MAKE SURE YOU:  °· Understand these instructions. °· Will watch your condition. °· Will get help right away if you are not doing well or get worse. °Document Released: 05/17/2005 Document Revised: 08/09/2011 Document Reviewed: 10/14/2010 °[employer]® Patient Information ©2015 [employer]. This information is not  intended to replace advice given to you by your health care provider. Make sure you discuss any questions you have with your health care provider. ° °

## 2014-11-17 NOTE — ED Provider Notes (Signed)
CSN: [id number]     Arrival date & time 11/17/14  1439 History   First MD Initiated Contact with Patient 11/17/14 1557     Chief Complaint  Patient presents with  . Abdominal Pain  . Nausea     (Consider location/radiation/quality/duration/timing/severity/associated sxs/prior Treatment) Patient is a 39 y.o. male presenting with abdominal pain. The history is provided by the patient.  Abdominal Pain Associated symptoms: chills, diarrhea, fatigue, nausea and vomiting   Associated symptoms: no shortness of breath    since Thursday patient has had nausea vomiting with some diarrhea. He's had a decreased appetite. Some chills without fever. States he has felt a little bad. Has had decreased appetite. States abdomen has gotten larger. CT ventral daughters been drinking. States that he has had some green diarrhea. States he has been struggling with episodes of diarrhea for a long time.  History reviewed. No pertinent past medical history. Past Surgical History  Procedure Laterality Date  . Cystoscopy      remove scar tissue from urinary tract   Family History  Problem Relation Age of Onset  . Arthritis Maternal Grandfather   . Prostate cancer Maternal Grandfather   . Hypertension Mother   . Hypertension Maternal Grandmother   . Diabetes Father   . Diabetes Maternal Grandmother   . Diabetes Maternal Uncle    History  Substance Use Topics  . Smoking status: Current Every Day Smoker  . Smokeless tobacco: Not on file  . Alcohol Use: Yes    Review of Systems  Constitutional: Positive for chills, appetite change and fatigue.  Respiratory: Negative for shortness of breath.   Gastrointestinal: Positive for nausea, vomiting, abdominal pain and diarrhea.  Genitourinary: Positive for flank pain.  Musculoskeletal: Negative for back pain.  Skin: Negative for wound.  Neurological: Positive for light-headedness.      Allergies  Review of patient's allergies indicates no known  allergies.  Home Medications   Prior to Admission medications   Medication Sig Start Date End Date Taking? Authorizing Provider  naproxen sodium (ANAPROX) 220 MG tablet Take 440 mg by mouth daily as needed (pain). ALEVE   Yes Historical Provider, MD  omeprazole (PRILOSEC OTC) 20 MG tablet Take 20 mg by mouth at bedtime.   Yes Historical Provider, MD  ondansetron (ZOFRAN-ODT) 8 MG disintegrating tablet Take 1 tablet (8 mg total) by mouth every 8 (eight) hours as needed for nausea or vomiting. 11/17/14   Benjiman Core, MD   BP 128/85 mmHg  Pulse 58  Temp(Src) 98.4 F (36.9 C) (Oral)  Resp 18  Ht 5\' 9"  (1.753 m)  Wt 185 lb (83.915 kg)  BMI 27.31 kg/m2  SpO2 99% Physical Exam  Constitutional: He is oriented to person, place, and time. He appears well-developed and well-nourished.  HENT:  Head: Normocephalic and atraumatic.  Eyes: Pupils are equal, round, and reactive to light.  Neck: Normal range of motion.  Cardiovascular: Normal rate, regular rhythm and normal heart sounds.   No murmur heard. Pulmonary/Chest: Effort normal and breath sounds normal.  Abdominal: Soft. He exhibits distension. There is tenderness.  Mild distention. Some right lateral abdominal tenderness superiorly.  Musculoskeletal: Normal range of motion. He exhibits no edema.  Neurological: He is alert and oriented to person, place, and time. No cranial nerve deficit.  Skin: Skin is warm.  Psychiatric: He has a normal mood and affect.  Nursing note and vitals reviewed.   ED Course  Procedures (including critical care time) Labs Review Labs Reviewed  LIPASE,  BLOOD - Abnormal; Notable for the following:    Lipase 18 (*)    All other components within normal limits  CBC WITH DIFFERENTIAL/PLATELET  COMPREHENSIVE METABOLIC PANEL    Imaging Review Dg Abd 2 Views  11/17/2014   CLINICAL DATA:  Nausea and vomiting.  Right-sided abdominal pain  EXAM: ABDOMEN - 2 VIEW  COMPARISON:  None.  FINDINGS: Moderate  volume of bowel gas but no indication of obstruction or perforation. No concerning intra-abdominal mass effect or calcification. The bladder is homogeneously dense, suspect recent contrasted CT study.  Increased markings at the bases is symmetric and likely from interstitial crowding in the setting of low lung volumes.  IMPRESSION: Normal bowel gas pattern.   Electronically Signed   By: Marnee Spring M.D.   On: 11/17/2014 17:16     EKG Interpretation None      MDM   Final diagnoses:  Nausea and vomiting  Abdominal pain, unspecified abdominal location     patient with nausea vomiting and some abdominal pain. Has tolerated orals here and labs reassuring. X-ray reassuring. Has had history of diarrhea somewhat chronically in [place] related to this episode. Will discharge home and will have follow-up with his PCP and possibly GI.  Benjiman Core, MD 11/17/14 2024

## 2014-11-17 NOTE — ED Notes (Signed)
Pt c/o right sided abdominal pain with nausea and diarrhea onset Friday. Pt also reports that he also feels bloated.

## 2015-01-24 DIAGNOSIS — N35914 Unspecified anterior urethral stricture, male: Secondary | ICD-10-CM | POA: Insufficient documentation

## 2017-05-10 ENCOUNTER — Ambulatory Visit (HOSPITAL_COMMUNITY)
Admission: EM | Admit: 2017-05-10 | Discharge: 2017-05-10 | Disposition: A | Discharge status: Home / Self Care | Payer: Managed Care, Other (non HMO) | Attending: Internal Medicine | Admitting: Internal Medicine

## 2017-05-10 ENCOUNTER — Encounter (HOSPITAL_COMMUNITY): Payer: Self-pay | Admitting: Emergency Medicine

## 2017-05-10 DIAGNOSIS — S161XXA Strain of muscle, fascia and tendon at neck level, initial encounter: Secondary | ICD-10-CM

## 2017-05-10 DIAGNOSIS — S39012A Strain of muscle, fascia and tendon of lower back, initial encounter: Secondary | ICD-10-CM | POA: Diagnosis not present

## 2017-05-10 MED ORDER — CYCLOBENZAPRINE HCL 5 MG PO TABS: 5.0000 mg | ORAL_TABLET | Freq: Three times a day (TID) | ORAL | 0 refills | Status: AC | PRN

## 2017-05-10 MED ORDER — ETODOLAC 500 MG PO TABS: 500.0000 mg | ORAL_TABLET | Freq: Two times a day (BID) | ORAL | 0 refills | Status: AC
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
## 2017-05-10 NOTE — Discharge Instructions (Signed)
Please take medications as prescribed.  Follow-up with orthopedics if no improvement in 1 week.  Return to clinic for any worsening symptoms or changes in health.

## 2019-05-19 ENCOUNTER — Telehealth: Payer: Managed Care, Other (non HMO) | Admitting: Physician Assistant

## 2019-05-19 DIAGNOSIS — R05 Cough: Secondary | ICD-10-CM

## 2019-05-19 DIAGNOSIS — R053 Chronic cough: Secondary | ICD-10-CM

## 2019-05-19 NOTE — Progress Notes (Signed)
Based on what you shared with me, I feel your condition warrants further evaluation and I recommend that you be seen for a face to face office visit. You are endorsing a cough beginning in June of 2019 per your questionnaire. This is something that needs a detailed evaluation outside of the scope of this e-visit. Please call your primary care provider to schedule an appointment to discuss.    NOTE: If you entered your credit card information for this eVisit, you will not be charged. You may see a "hold" on your card for the $35 but that hold will drop off and you will not have a charge processed.   If you are having a true medical emergency please call 911.      For an urgent face to face visit, [facility] has five urgent care centers for your convenience:      NEW:  [facility] [place] [facility] [place] Get Driving Directions [phone] [address], [place] [address] . 10 am - 6pm Monday - Friday    [place] [facility]) Get Driving Directions [phone] [address] [place], [place] [address] . 10 am to 8 pm Monday-Friday . 12 pm to 8 pm [facility] at [facility] Get Driving Directions [phone] [place], [place], [place] [address] . 8 am to 8 pm Monday-Friday . 9 am to 6 pm Saturday . 11 am to 6 pm Sunday     [facility] at [facility] Get Driving Directions  [phone] [address] [place], [place] [address] . 8 am to 8 pm Monday-Friday . 8 am to 4 pm [facility] at [place] Get Driving Directions [phone] [address] [place], [place] [address] . 12 pm to 6 pm Monday-Friday      Your e-visit answers were reviewed by a board certified advanced clinical practitioner to complete your personal care plan.  Thank you for using e-Visits.

## 2019-09-20 ENCOUNTER — Other Ambulatory Visit: Payer: Self-pay

## 2019-09-20 ENCOUNTER — Emergency Department (HOSPITAL_COMMUNITY)
Admission: EM | Admit: 2019-09-20 | Discharge: 2019-09-20 | Disposition: A | Discharge status: Other Acute Inpatient Hospital | Payer: Self-pay | Attending: Emergency Medicine | Admitting: Emergency Medicine

## 2019-09-20 ENCOUNTER — Encounter (HOSPITAL_COMMUNITY): Payer: Self-pay

## 2019-09-20 ENCOUNTER — Emergency Department (HOSPITAL_COMMUNITY): Payer: Self-pay

## 2019-09-20 DIAGNOSIS — F17200 Nicotine dependence, unspecified, uncomplicated: Secondary | ICD-10-CM | POA: Insufficient documentation

## 2019-09-20 DIAGNOSIS — Y9289 Other specified places as the place of occurrence of the external cause: Secondary | ICD-10-CM | POA: Insufficient documentation

## 2019-09-20 DIAGNOSIS — W228XXA Striking against or struck by other objects, initial encounter: Secondary | ICD-10-CM | POA: Insufficient documentation

## 2019-09-20 DIAGNOSIS — Y999 Unspecified external cause status: Secondary | ICD-10-CM | POA: Insufficient documentation

## 2019-09-20 DIAGNOSIS — Z23 Encounter for immunization: Secondary | ICD-10-CM | POA: Insufficient documentation

## 2019-09-20 DIAGNOSIS — Z79899 Other long term (current) drug therapy: Secondary | ICD-10-CM | POA: Insufficient documentation

## 2019-09-20 DIAGNOSIS — S01111A Laceration without foreign body of right eyelid and periocular area, initial encounter: Secondary | ICD-10-CM | POA: Insufficient documentation

## 2019-09-20 DIAGNOSIS — Y9389 Activity, other specified: Secondary | ICD-10-CM | POA: Insufficient documentation

## 2019-09-20 MED ORDER — TETANUS-DIPHTH-ACELL PERTUSSIS 5-2.5-18.5 LF-MCG/0.5 IM SUSP
0.5000 mL | Freq: Once | INTRAMUSCULAR | Status: AC
  Administered 2019-09-20: 0.5 mL via INTRAMUSCULAR
  Filled 2019-09-20: qty 0.5

## 2019-09-20 MED ORDER — FLUORESCEIN SODIUM 1 MG OP STRP
1.0000 | ORAL_STRIP | Freq: Once | OPHTHALMIC | Status: AC
  Administered 2019-09-20: 1 via OPHTHALMIC
  Filled 2019-09-20: qty 1

## 2019-09-20 MED ORDER — TETRACAINE HCL 0.5 % OP SOLN
1.0000 [drp] | Freq: Once | OPHTHALMIC | Status: AC
  Administered 2019-09-20: 18:00:00 1 [drp] via OPHTHALMIC
  Filled 2019-09-20: qty 4

## 2019-09-20 MED ORDER — LEVOFLOXACIN IN D5W 750 MG/150ML IV SOLN
750.0000 mg | Freq: Once | INTRAVENOUS | Status: AC
  Administered 2019-09-20: 19:00:00 750 mg via INTRAVENOUS
  Filled 2019-09-20: qty 150
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
## 2019-09-20 NOTE — ED Notes (Signed)
[facility] called eta about 30-45 min

## 2019-09-20 NOTE — ED Notes (Signed)
Paged oculoplastics Per Dr. Hyacinth Meeker

## 2019-09-20 NOTE — ED Notes (Signed)
Gave report to [facility] charge nurse and updated on ETA.
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
## 2019-09-20 NOTE — ED Notes (Signed)
Oculoplastics repaged per Dr. Hyacinth Meeker
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
## 2019-09-22 MED ORDER — ERYTHROMYCIN 5 MG/GM OP OINT
TOPICAL_OINTMENT | OPHTHALMIC | Status: DC
Start: 2019-09-22 — End: 2019-09-22
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
## 2022-02-27 ENCOUNTER — Ambulatory Visit
Admission: EM | Admit: 2022-02-27 | Discharge: 2022-02-27 | Disposition: A | Discharge status: Home / Self Care | Payer: BC Managed Care – PPO

## 2022-02-27 ENCOUNTER — Emergency Department (HOSPITAL_COMMUNITY): Payer: BC Managed Care – PPO

## 2022-02-27 ENCOUNTER — Other Ambulatory Visit: Payer: Self-pay

## 2022-02-27 ENCOUNTER — Emergency Department (HOSPITAL_COMMUNITY)
Admission: EM | Admit: 2022-02-27 | Discharge: 2022-02-27 | Disposition: A | Discharge status: Home / Self Care | Payer: BC Managed Care – PPO | Attending: Emergency Medicine | Admitting: Emergency Medicine

## 2022-02-27 ENCOUNTER — Encounter (HOSPITAL_COMMUNITY): Payer: Self-pay

## 2022-02-27 DIAGNOSIS — S59901A Unspecified injury of right elbow, initial encounter: Secondary | ICD-10-CM | POA: Diagnosis not present

## 2022-02-27 DIAGNOSIS — Y990 Civilian activity done for income or pay: Secondary | ICD-10-CM | POA: Insufficient documentation

## 2022-02-27 DIAGNOSIS — W228XXA Striking against or struck by other objects, initial encounter: Secondary | ICD-10-CM | POA: Diagnosis not present

## 2022-02-27 DIAGNOSIS — L03113 Cellulitis of right upper limb: Secondary | ICD-10-CM | POA: Diagnosis not present

## 2022-02-27 DIAGNOSIS — L0390 Cellulitis, unspecified: Secondary | ICD-10-CM | POA: Diagnosis not present

## 2022-02-27 DIAGNOSIS — M25521 Pain in right elbow: Secondary | ICD-10-CM | POA: Diagnosis not present

## 2022-02-27 DIAGNOSIS — Z23 Encounter for immunization: Secondary | ICD-10-CM | POA: Diagnosis not present

## 2022-02-27 DIAGNOSIS — M7989 Other specified soft tissue disorders: Secondary | ICD-10-CM | POA: Diagnosis not present

## 2022-02-27 MED ORDER — KETOROLAC TROMETHAMINE 15 MG/ML IJ SOLN
15.0000 mg | Freq: Once | INTRAMUSCULAR | Status: AC
  Administered 2022-02-27: 15 mg via INTRAMUSCULAR
  Filled 2022-02-27: qty 1

## 2022-02-27 MED ORDER — ACETAMINOPHEN 500 MG PO TABS
1000.0000 mg | ORAL_TABLET | Freq: Once | ORAL | Status: AC
  Administered 2022-02-27: 1000 mg via ORAL
  Filled 2022-02-27: qty 2

## 2022-02-27 MED ORDER — TETANUS-DIPHTH-ACELL PERTUSSIS 5-2.5-18.5 LF-MCG/0.5 IM SUSY
0.5000 mL | PREFILLED_SYRINGE | Freq: Once | INTRAMUSCULAR | Status: AC
  Administered 2022-02-27: 0.5 mL via INTRAMUSCULAR
  Filled 2022-02-27: qty 0.5

## 2022-02-27 MED ORDER — CEPHALEXIN 500 MG PO CAPS: 500.0000 mg | ORAL_CAPSULE | Freq: Two times a day (BID) | ORAL | 0 refills | Status: AC

## 2022-02-27 MED ORDER — CEPHALEXIN 500 MG PO CAPS
500.0000 mg | ORAL_CAPSULE | Freq: Once | ORAL | Status: AC
  Administered 2022-02-27: 500 mg via ORAL
  Filled 2022-02-27: qty 1

## 2022-02-27 MED ORDER — DOXYCYCLINE HYCLATE 100 MG PO TABS
100.0000 mg | ORAL_TABLET | Freq: Once | ORAL | Status: AC
  Administered 2022-02-27: 100 mg via ORAL
  Filled 2022-02-27: qty 1

## 2022-02-27 MED ORDER — DOXYCYCLINE HYCLATE 100 MG PO CAPS: 100.0000 mg | ORAL_CAPSULE | Freq: Two times a day (BID) | ORAL | 0 refills | Status: AC

## 2022-02-27 NOTE — ED Provider Triage Note (Signed)
Emergency Medicine Provider Triage Evaluation Note  Colin Black , a 46 y.o. male  was evaluated in triage.  Pt complains of right elbow pain. He reports he bumped and scratched it on Thursday and has been swollen since Friday. Pain with movement.  Review of Systems  Positive:  Negative:   Physical Exam  BP (!) 158/82 (BP Location: Left Arm)   Pulse 97   Temp 98.7 F (37.1 C) (Oral)   Resp 18   Ht 5\' 9"  (1.753 m)   Wt 106.6 kg   SpO2 98%   BMI 34.70 kg/m  Gen:   Awake, no distress   Resp:  Normal effort  MSK:   Moves extremities without difficulty  Other:  Swollen and warm right elbow. Full Rom with pain. Compartments soft. Palpable pulses.   Medical Decision Making  Medically screening exam initiated at 1:24 PM.  Appropriate orders placed.  Colin Black [facility] was informed that the remainder of the evaluation will be completed by another provider, this initial triage assessment does not replace that evaluation, and the importance of remaining in the ED until their evaluation is complete.  XR ordered   Colin Puller, PA-C 02/27/22 1325

## 2022-02-27 NOTE — Discharge Instructions (Addendum)
You were seen today for left elbow pain. We did not identify any emergent cause for your symptoms. Your evaluation is most consistent with cellulitis.  As we discussed, this infection may progress and if you begin to have any mobility of her elbow, you must immediately return to an emergency department for work-up for septic arthritis as this development would put you at risk of losing your arm function. Plan and next steps:   The following may be helpful in managing your symptoms:   Pain- Lidocaine Patches  Apply to affected area for up to 12 hours at a time.   Pain/Fever- Adult Tylenol dosing:  650 mg orally every 4 to 6 hours as needed, MAX: 3250 mg/24 hours   (Extra-strength) 1000 mg orally every 6 hours as needed; MAX: 3000 mg/24 hours   Do not use if you have liver disease. Read the label on the bottle.   Pain/Fever- Adult Ibuprofen Dosing  200 to 400 mg orally every 4 to 6 hours as needed; MAX 1200 mg/day; do not take longer than 10 days   Do not use if you have kidney disease. Read the label on the bottle   Gastritis/Heartburn- Omeprazole Take 20mg  Daily for 14 days if needed with ibuprofen  Findings:  You may see all of your lab and imaging results utilizing our online portal! Look in this document or ask a team member for your mychart* access information. The most notable results have additionally been verbally communicated with you and your bedside family.    Follow-up Plan:   Follow up with the patient's normal primary care provider for monitoring of this condition within 48 hours.   Signs/Symptoms that would warrant return to the ED:  Please return to the ED if you experience worsening of symptoms or any abrupt changes in your health. Standard of care precautions for your chief complaint have already been verbally communicated with you. Always be on alert for fevers, chills, shortness of breath, chest pains, or sudden changes that warrant immediate evaluation.    Thank you  for allowing [place] to be a part of you and your families' care.   Tretha Sciara MD

## 2022-02-27 NOTE — ED Provider Notes (Signed)
[facility] DEPT Provider Note   CSN: [id number] Arrival date & time: 02/27/22  1318     History Chief Complaint  Patient presents with   Elbow Injury    HPI Colin Black is a 46 y.o. male presenting for right elbow pain.  He states that he scratched his right posterior elbow on a piece of metal at work approximately 3 days ago.  He woke up with redness and pain to the elbow.  He has full range of motion.  He has no pain with motion of the elbow.  However he does have pain with palpation of the area around the cut.  It is approximately 1 cm cut on his right posterior elbow.  He denies fevers or chills, nausea vomiting Syncope shortness of breath.  No medications prior to arrival..   Patient's recorded medical, surgical, social, medication list and allergies were reviewed in the Snapshot window as part of the initial history.   Review of Systems   Review of Systems  Constitutional:  Negative for chills and fever.  HENT:  Negative for ear pain and sore throat.   Eyes:  Negative for pain and visual disturbance.  Respiratory:  Negative for cough and shortness of breath.   Cardiovascular:  Negative for chest pain and palpitations.  Gastrointestinal:  Negative for abdominal pain and vomiting.  Genitourinary:  Negative for dysuria and hematuria.  Musculoskeletal:  Negative for arthralgias and back pain.  Skin:  Negative for color change and rash.  Neurological:  Negative for seizures and syncope.  All other systems reviewed and are negative.   Physical Exam Updated Vital Signs BP (!) 158/82 (BP Location: Left Arm)   Pulse 97   Temp 98.7 F (37.1 C) (Oral)   Resp 18   Ht 5\' 9"  (1.753 m)   Wt 106.6 kg   SpO2 98%   BMI 34.70 kg/m  Physical Exam Vitals and nursing note reviewed.  Constitutional:      General: He is not in acute distress.    Appearance: He is well-developed.  HENT:     Head: Normocephalic and atraumatic.  Eyes:      Conjunctiva/sclera: Conjunctivae normal.  Cardiovascular:     Rate and Rhythm: Normal rate and regular rhythm.     Heart sounds: No murmur heard. Pulmonary:     Effort: Pulmonary effort is normal. No respiratory distress.     Breath sounds: Normal breath sounds.  Abdominal:     Palpations: Abdomen is soft.     Tenderness: There is no abdominal tenderness.  Musculoskeletal:        General: Signs of injury present. No swelling.     Cervical back: Neck supple.     Comments: 1 cm healing laceration on patient's right posterior elbow.  Overlying erythema and redness.  Full range of motion of the elbow.  Active and passive without pain.  Skin:    General: Skin is warm and dry.     Capillary Refill: Capillary refill takes less than 2 seconds.  Neurological:     Mental Status: He is alert.  Psychiatric:        Mood and Affect: Mood normal.      ED Course/ Medical Decision Making/ A&P    Procedures Ultrasound ED Soft Tissue  Date/Time: 02/27/2022 2:14 PM  Performed by: Tretha Sciara, MD Authorized by: Tretha Sciara, MD   Procedure details:    Indications: limb pain     Transverse view:  Visualized  Longitudinal view:  Visualized   Images: archived   Location:    Location: upper extremity     Side:  Right Findings:     no abscess present    cellulitis present    no foreign body present    Medications Ordered in ED Medications  doxycycline (VIBRA-TABS) tablet 100 mg (has no administration in time range)  cephALEXin (KEFLEX) capsule 500 mg (has no administration in time range)  acetaminophen (TYLENOL) tablet 1,000 mg (has no administration in time range)  ketorolac (TORADOL) 15 MG/ML injection 15 mg (has no administration in time range)  Tdap (BOOSTRIX) injection 0.5 mL (0.5 mLs Intramuscular Given 02/27/22 1352)    Medical Decision Making:    Colin Black is a 46 y.o. male who presented to the ED today with right elbow pain detailed above.     Patient's  presentation is complicated by their history of obesity.  Patient placed on continuous vitals and telemetry monitoring while in ED which was reviewed periodically.   Complete initial physical exam performed, notably the patient  was hemodynamically stable in no acute distress.  Area of overlying erythema and warmth of the right posterior elbow.  Does not circumscribe the entire joint.  Appears localized around the area of abrasion..      Reviewed and confirmed nursing documentation for past medical history, family history, social history.    Initial Assessment:   With the patient's presentation of right elbow pain in the setting of untreated laceration, poor wound care (patient endorsed using a dirty rag to clean it), most likely diagnosis is cellulitis. Other diagnoses were considered including (but not limited to) septic arthritis, septic bursitis, abscess. These are considered less likely due to history of present illness and physical exam findings.  In particular, septic arthritis is considered less likely due to full range of motion, lack of systemic fever, location of erythema without penetration around the full elbow This is most consistent with an acute life/limb threatening illness complicated by underlying chronic conditions.  Initial Plan:  X-ray to evaluate for underlying fracture, traumatic injury Ultrasound to evaluate for joint effusion, abscess Objective evaluation as below reviewed with plan for close reassessment  Initial Study Results:    Radiology  All images reviewed independently. Agree with radiology report at this time.   DG Elbow Complete Right  Result Date: 02/27/2022 CLINICAL DATA:  Pain and swelling EXAM: RIGHT ELBOW - COMPLETE 3+ VIEW COMPARISON:  None Available. FINDINGS: No recent fracture or dislocation is seen. The soft tissue swelling over the olecranon process. There is thin linear calcific density adjacent to the olecranon process, possibly old avulsion or  ligament calcification from previous injury. There is no displacement of posterior fat pad. IMPRESSION: No fracture or dislocation is seen in right elbow. Small thin linear calcific density adjacent to the olecranon process may suggest calcific bursitis or old avulsion. There is soft tissue swelling over the olecranon process suggesting contusion/hematoma or bursitis. Electronically Signed   By: Elmer Picker M.D.   On: 02/27/2022 14:09    Point-of-care ultrasound with fluid in the superficial spaces consistent with developing cellulitis.  No joint effusion visualized, no abscess visualized.  Final Assessment and Plan:   Patient's history present on his physical symptoms are most consistent with cellulitis after poorly treated wound.  No abscess or other evidence of septic arthritis at this time.  We will treat patient with Keflex, doxycycline for coverage of skin flora including staph given close localization to elbow and  recommended patient follow-up with primary care provider within 48 hours. Strict return precautions regarding development of septic arthritis reinforced with the patient expressed understanding.  Patient stable for outpatient care management at this time. Clinical Impression:  1. Cellulitis, unspecified cellulitis site      Discharge   Final Clinical Impression(s) / ED Diagnoses Final diagnoses:  Cellulitis, unspecified cellulitis site    Rx / DC Orders ED Discharge Orders          Ordered    cephALEXin (KEFLEX) 500 MG capsule  2 times daily        02/27/22 1418    doxycycline (VIBRAMYCIN) 100 MG capsule  2 times daily        02/27/22 1418              Tretha Sciara, MD 02/27/22 1419

## 2022-02-27 NOTE — ED Triage Notes (Signed)
Patient reports that he hit his right elbow at work on a metal object x 2 days. Patient is a Dealer. Patient has pain, redness, and swelling to the right elbow.

## 2022-02-27 NOTE — ED Triage Notes (Signed)
Pt was referred to emergency dept due to possible infection in right elbow with swelling and hot to touch.

## 2023-05-08 ENCOUNTER — Ambulatory Visit
Admission: EM | Admit: 2023-05-08 | Discharge: 2023-05-08 | Disposition: A | Discharge status: Home / Self Care | Payer: PRIVATE HEALTH INSURANCE | Attending: Emergency Medicine | Admitting: Emergency Medicine

## 2023-05-08 DIAGNOSIS — F17200 Nicotine dependence, unspecified, uncomplicated: Secondary | ICD-10-CM

## 2023-05-08 DIAGNOSIS — H66003 Acute suppurative otitis media without spontaneous rupture of ear drum, bilateral: Secondary | ICD-10-CM | POA: Diagnosis not present

## 2023-05-08 MED ORDER — AMOXICILLIN-POT CLAVULANATE 875-125 MG PO TABS: 1.0000 | ORAL_TABLET | Freq: Two times a day (BID) | ORAL | 0 refills | Status: AC

## 2023-05-08 NOTE — ED Triage Notes (Signed)
"  This started with sore throat the week before Thanksgiving (11-21) upon waking up that morning". Since then the sore throat and hoarse voice hasn't improved or changed. No runny nose. No cough. No new/unexplained rash. No fever.

## 2023-05-08 NOTE — Discharge Instructions (Addendum)
Consider pseudoephedrine 120 mg tablet 1 tab twice daily this is an over-the-counter medication also Mucinex 1 tablet twice daily.  Generic form of either medication is acceptable.  Take antibiotic as ordered until completed.  Increase water intake.  Consider stop smoking at this time.  You may want to be evaluated by your primary care provider for COPD.

## 2023-05-08 NOTE — ED Provider Notes (Incomplete)
[facility]    CSN: [id number] Arrival date & time: 05/08/23  1413      History   Chief Complaint Chief Complaint  Patient presents with   Sore Throat   Hoarse    HPI Colin Black is a 47 y.o. male.   Patient presents to urgent care for upper viral symptoms that have lasted for 3 weeks.  He states he is continuing to cough yellow mucus.  Reporting occasional shortness of breath.  He states he is having intermittent headaches and facial pain.  The history is provided by the patient.  Sore Throat    History reviewed. No pertinent past medical history.  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 Authorizing Provider  cyclobenzaprine (FLEXERIL) 5 MG tablet Take 1-2 tablets (5-10 mg total) by mouth 3 (three) times daily as needed for muscle spasms. Patient not taking: Reported on 09/20/2019 05/10/17   Evon Slack, PA-C  etodolac (LODINE) 500 MG tablet Take 1 tablet (500 mg total) by mouth 2 (two) times daily. Patient not taking: Reported on 09/20/2019 05/10/17   Evon Slack, PA-C  omeprazole (PRILOSEC OTC) 20 MG tablet Take 20 mg by mouth daily as needed (For heartburn).     [provider]  ondansetron (ZOFRAN-ODT) 8 MG disintegrating tablet Take 1 tablet (8 mg total) by mouth every 8 (eight) hours as needed for nausea or vomiting. Patient not taking: Reported on 09/20/2019 11/17/14   Benjiman Core, MD    Family History Family History  Problem Relation Age of Onset   Hypertension Mother    Diabetes Father     Hypertension Maternal Grandmother    Diabetes Maternal Grandmother    Arthritis Maternal Grandfather    Prostate cancer Maternal Grandfather    Diabetes Maternal Uncle     Social History Social History   Tobacco Use   Smoking status: Some Days    Types: Cigarettes  Vaping Use   Vaping status: Never Used  Substance Use Topics   Alcohol use: Yes    Comment: Occassionally.   Drug use: No     Allergies   Patient has no known allergies.   Review of Systems Review of Systems   Physical Exam Triage Vital Signs ED Triage Vitals  Encounter Vitals Group     BP 05/08/23 1425 116/71     Systolic BP Percentile --      Diastolic BP Percentile --      Pulse Rate 05/08/23 1425 92     Resp 05/08/23 1425 18     Temp 05/08/23 1425 98.7 F (37.1 C)     Temp Source 05/08/23 1425 Oral     SpO2 05/08/23 1425 96 %     Weight 05/08/23 1423 235 lb 0.2 oz (106.6 kg)     Height 05/08/23 1423 5\' 9"  (1.753 m)     Head Circumference --      Peak Flow --      Pain Score 05/08/23 1420 0     Pain Loc --  Pain Education --      Exclude from Growth Chart --    No data found.  Updated Vital Signs BP 116/71 (BP Location: Right Arm)   Pulse 92   Temp 98.7 F (37.1 C) (Oral)   Resp 18   Ht 5\' 9"  (1.753 m)   Wt 235 lb 0.2 oz (106.6 kg)   SpO2 96%   BMI 34.71 kg/m   Visual Acuity Right Eye Distance:   Left Eye Distance:   Bilateral Distance:    Right Eye Near:   Left Eye Near:    Bilateral Near:     Physical Exam   UC Treatments / Results  Labs (all labs ordered are listed, but only abnormal results are displayed) Labs Reviewed - No data to display  EKG   Radiology No results found.  Procedures Procedures (including critical care time)  Medications Ordered in UC Medications - No data to display  Initial Impression / Assessment and Plan / UC Course  I have reviewed the triage vital signs and the nursing notes.  Pertinent labs & imaging results that were  available during my care of the patient were reviewed by me and considered in my medical decision making (see chart for details).     *** Final Clinical Impressions(s) / UC Diagnoses   Final diagnoses:  None   Discharge Instructions   None    ED Prescriptions   None    PDMP not reviewed this encounter.

## 2023-12-23 ENCOUNTER — Encounter: Payer: Self-pay | Admitting: Family Medicine

## 2023-12-23 ENCOUNTER — Ambulatory Visit (INDEPENDENT_AMBULATORY_CARE_PROVIDER_SITE_OTHER): Payer: Self-pay | Admitting: Family Medicine

## 2023-12-23 VITALS — BP 138/82 | HR 82 | Resp 16 | Ht 69.0 in | Wt 243.1 lb

## 2023-12-23 DIAGNOSIS — Z8042 Family history of malignant neoplasm of prostate: Secondary | ICD-10-CM

## 2023-12-23 DIAGNOSIS — E7849 Other hyperlipidemia: Secondary | ICD-10-CM | POA: Diagnosis not present

## 2023-12-23 DIAGNOSIS — R7301 Impaired fasting glucose: Secondary | ICD-10-CM | POA: Diagnosis not present

## 2023-12-23 DIAGNOSIS — K219 Gastro-esophageal reflux disease without esophagitis: Secondary | ICD-10-CM

## 2023-12-23 DIAGNOSIS — Z114 Encounter for screening for human immunodeficiency virus [HIV]: Secondary | ICD-10-CM

## 2023-12-23 DIAGNOSIS — E038 Other specified hypothyroidism: Secondary | ICD-10-CM | POA: Diagnosis not present

## 2023-12-23 DIAGNOSIS — Z1211 Encounter for screening for malignant neoplasm of colon: Secondary | ICD-10-CM

## 2023-12-23 DIAGNOSIS — Z1159 Encounter for screening for other viral diseases: Secondary | ICD-10-CM | POA: Diagnosis not present

## 2023-12-23 DIAGNOSIS — F17200 Nicotine dependence, unspecified, uncomplicated: Secondary | ICD-10-CM

## 2023-12-23 DIAGNOSIS — E559 Vitamin D deficiency, unspecified: Secondary | ICD-10-CM

## 2023-12-23 DIAGNOSIS — Z125 Encounter for screening for malignant neoplasm of prostate: Secondary | ICD-10-CM

## 2023-12-23 NOTE — Progress Notes (Signed)
 New Patient Office Visit  Subjective:  Patient ID: Colin Black, male    DOB: Sep 30, 1975  Age: 48 y.o. MRN: [id number]  CC:  Chief Complaint  Patient presents with   Establish Care    Wants prostate, blood sugar checked. Has noticed he tends to get shortwinded with some activities.    Gastroesophageal Reflux    Has been having a lot of heart burn    HPI Colin Black is a 48 y.o. male with past medical history of GERD presents for establishing care. For the details of today's visit, please refer to the assessment and plan.   No past medical history on file.  Past Surgical History:  Procedure Laterality Date   CYSTOSCOPY     remove scar tissue from urinary tract    Family History  Problem Relation Age of Onset   Hypertension Mother    Diabetes Father    Hypertension Maternal Grandmother    Diabetes Maternal Grandmother    Arthritis Maternal Grandfather    Prostate cancer Maternal Grandfather    Diabetes Maternal Uncle     Social History   Socioeconomic History   Marital status: Single    Spouse name: Not on file   Number of children: Not on file   Years of education: Not on file   Highest education level: Not on file  Occupational History   Not on file  Tobacco Use   Smoking status: Some Days    Types: Cigarettes   Smokeless tobacco: Not on file  Vaping Use   Vaping status: Never Used  Substance and Sexual Activity   Alcohol use: Yes    Comment: Occassionally.   Drug use: No   Sexual activity: Not Currently  Other Topics Concern   Not on file  Social History Narrative   Not on file   Social Drivers of Health   Financial Resource Strain: Not on file  Food Insecurity: Not on file  Transportation Needs: Not on file  Physical Activity: Not on file  Stress: Not on file  Social Connections: Not on file  Intimate Partner Violence: Not on file    ROS Review of Systems  Constitutional:  Negative for fatigue and fever.  Eyes:  Negative for  visual disturbance.  Respiratory:  Negative for chest tightness and shortness of breath.   Cardiovascular:  Negative for chest pain and palpitations.  Neurological:  Negative for dizziness and headaches.    Objective:   Today's Vitals: BP 138/82   Pulse 82   Resp 16   Ht 5' 9 (1.753 m)   Wt 243 lb 1.9 oz (110.3 kg)   SpO2 96%   BMI 35.90 kg/m   Physical Exam HENT:     Head: Normocephalic.     Right Ear: External ear normal.     Left Ear: External ear normal.     Nose: No congestion or rhinorrhea.     Mouth/Throat:     Mouth: Mucous membranes are moist.  Cardiovascular:     Rate and Rhythm: Regular rhythm.     Heart sounds: No murmur heard. Pulmonary:     Effort: No respiratory distress.     Breath sounds: Normal breath sounds.  Neurological:     Mental Status: He is alert.      Assessment & Plan:   Gastroesophageal reflux disease without esophagitis Assessment & Plan: The patient reports a reduction in alcohol consumption. He has been taking omeprazole 20 mg every other day with minimal  relief of his symptoms. He was encouraged to increase the omeprazole dosage to 20 mg daily for improved symptom control.  Lifestyle and Dietary Recommendations: -Avoid Trigger Foods and Drinks: The patient is advised to limit or eliminate the following, which may exacerbate gastrointestinal symptoms: -Coffee, chocolate, onions, peppermint, spicy foods -Carbonated beverages, citrus fruits, tomatoes, garlic -Alcohol and fatty foods (e.g., bacon, burgers, sausages, steak, fried foods, dairy)  Recommended Foods: Encourage a diet rich in: -High-fiber options such as whole grain cereals, oatmeal, brown rice, root vegetables, and non-citrus fruits -High-protein foods and healthy fats including avocados, olive oil, nuts, and seeds    Tobacco use disorder Assessment & Plan: Smokes about 1/2 pack/day  Asked about quitting: confirms that he currently smokes cigarettes Advise to quit  smoking: Educated about QUITTING to reduce the risk of cancer, cardio and cerebrovascular disease. Assess willingness:  working on cutting back. Assist with counseling and pharmacotherapy: Counseled for 5 minutes and literature provided. Arrange for follow up: follow up in 3 months and continue to offer help.    Family history of malignant neoplasm of prostate Assessment & Plan: Asymptomatic Pending PSA  Orders: -     PSA, total and free  IFG (impaired fasting glucose) -     Hemoglobin A1c  Vitamin D  deficiency -     VITAMIN D  25 Hydroxy (Vit-D Deficiency, Fractures)  Need for hepatitis C screening test -     Hepatitis C antibody  Encounter for screening for HIV -     HIV Antibody (routine testing w rflx)  TSH (thyroid-stimulating hormone deficiency) -     TSH + free T4  Other hyperlipidemia -     Lipid panel -     CMP14+EGFR -     CBC with Differential/Platelet  Colon cancer screening -     Cologuard  Note: This chart has been completed using Engineer, civil (consulting) software, and while attempts have been made to ensure accuracy, certain words and phrases may not be transcribed as intended.     Follow-up: Return in about 4 months (around 04/24/2024).   James Lafalce, FNP

## 2023-12-23 NOTE — Patient Instructions (Signed)
 I appreciate the opportunity to provide care to you today!    Follow up:  4 months  Labs: please stop by the lab today to get your blood drawn (CBC, CMP, TSH, Lipid profile, HgA1c, Vit D, PSA)  Screening: HIV and Hep C   For managing GERD -Omeprazole 20 mg daily -I recommend the following lifestyle changes:  Avoid Certain Foods and Drinks: Limit or eliminate coffee, chocolate, onions, peppermint, spicy foods, carbonated beverages, citrus fruits, tomatoes, garlic, alcohol, and fatty foods such as bacon, burgers, sausages, steak, fried foods, and dairy products.  Recommended Foods: Increase your intake of high-fiber foods including whole grain cereals, oatmeal, brown rice, root vegetables, and non-citrus fruits. Opt for high-protein foods and healthy fats such as avocados, olive oil, nuts, and seeds.   For a Healthier YOU, I Recommend: Reducing your intake of sugar, sodium, carbohydrates, and saturated fats. Increasing your fiber intake by incorporating more whole grains, fruits, and vegetables into your meals. Setting healthy goals with a focus on lowering your consumption of carbs, sugar, and unhealthy fats. Adding variety to your diet by including a wide range of fruits and vegetables. Cutting back on soda and limiting processed foods as much as possible. Staying active: In addition to taking your weight loss medication, aim for at least 150 minutes of moderate-intensity physical activity each week for optimal results.   Please follow up if your symptoms worsen or fail to improve.    Please continue to a heart-healthy diet and increase your physical activities. Try to exercise for at least five days a week.    It was a pleasure to see you and I look forward to continuing to work together on your health and well-being. Please do not hesitate to call the office if you need care or have questions about your care.  In case of emergency, please visit the Emergency Department  for urgent care, or contact our clinic at [phone] to schedule an appointment. We're here to help you!   Have a wonderful day and week. With Gratitude, Harly Pipkins MSN, FNP-BC

## 2023-12-24 ENCOUNTER — Ambulatory Visit: Payer: Self-pay | Admitting: Family Medicine

## 2023-12-24 DIAGNOSIS — E559 Vitamin D deficiency, unspecified: Secondary | ICD-10-CM

## 2023-12-24 DIAGNOSIS — K219 Gastro-esophageal reflux disease without esophagitis: Secondary | ICD-10-CM | POA: Insufficient documentation

## 2023-12-24 DIAGNOSIS — E781 Pure hyperglyceridemia: Secondary | ICD-10-CM

## 2023-12-24 LAB — CBC WITH DIFFERENTIAL/PLATELET
Basophils Absolute: 0.1 x10E3/uL (ref 0.0–0.2)
Basos: 1 %
EOS (ABSOLUTE): 0.5 x10E3/uL — ABNORMAL HIGH (ref 0.0–0.4)
Eos: 7 %
Hematocrit: 48.3 % (ref 37.5–51.0)
Hemoglobin: 16.3 g/dL (ref 13.0–17.7)
Immature Grans (Abs): 0 x10E3/uL (ref 0.0–0.1)
Immature Granulocytes: 0 %
Lymphocytes Absolute: 2.7 x10E3/uL (ref 0.7–3.1)
Lymphs: 35 %
MCH: 33 pg (ref 26.6–33.0)
MCHC: 33.7 g/dL (ref 31.5–35.7)
MCV: 98 fL — ABNORMAL HIGH (ref 79–97)
Monocytes Absolute: 0.6 x10E3/uL (ref 0.1–0.9)
Monocytes: 8 %
Neutrophils Absolute: 3.7 x10E3/uL (ref 1.4–7.0)
Neutrophils: 49 %
Platelets: 241 x10E3/uL (ref 150–450)
RBC: 4.94 x10E6/uL (ref 4.14–5.80)
RDW: 11.8 % (ref 11.6–15.4)
WBC: 7.6 x10E3/uL (ref 3.4–10.8)

## 2023-12-24 LAB — CMP14+EGFR
ALT: 33 IU/L (ref 0–44)
AST: 27 IU/L (ref 0–40)
Albumin: 4.4 g/dL (ref 4.1–5.1)
Alkaline Phosphatase: 67 IU/L (ref 44–121)
BUN/Creatinine Ratio: 10 (ref 9–20)
BUN: 8 mg/dL (ref 6–24)
Bilirubin Total: 0.3 mg/dL (ref 0.0–1.2)
CO2: 21 mmol/L (ref 20–29)
Calcium: 9.7 mg/dL (ref 8.7–10.2)
Chloride: 101 mmol/L (ref 96–106)
Creatinine, Ser: 0.84 mg/dL (ref 0.76–1.27)
Globulin, Total: 3 g/dL (ref 1.5–4.5)
Glucose: 150 mg/dL — ABNORMAL HIGH (ref 70–99)
Potassium: 4.7 mmol/L (ref 3.5–5.2)
Sodium: 138 mmol/L (ref 134–144)
Total Protein: 7.4 g/dL (ref 6.0–8.5)
eGFR: 107 mL/min/1.73 (ref >59)

## 2023-12-24 LAB — LIPID PANEL
Chol/HDL Ratio: 2.7 ratio (ref 0.0–5.0)
Cholesterol, Total: 111 mg/dL (ref 100–199)
HDL: 41 mg/dL (ref >39)
LDL Chol Calc (NIH): 6 mg/dL (ref 0–99)
Triglycerides: 498 mg/dL — ABNORMAL HIGH (ref 0–149)
VLDL Cholesterol Cal: 64 mg/dL — ABNORMAL HIGH (ref 5–40)

## 2023-12-24 LAB — TSH+FREE T4
Free T4: 1.04 ng/dL (ref 0.82–1.77)
TSH: 0.816 u[IU]/mL (ref 0.450–4.500)

## 2023-12-24 LAB — HEPATITIS C ANTIBODY: Hep C Virus Ab: NONREACTIVE

## 2023-12-24 LAB — HIV ANTIBODY (ROUTINE TESTING W REFLEX): HIV Screen 4th Generation wRfx: NONREACTIVE

## 2023-12-24 LAB — VITAMIN D 25 HYDROXY (VIT D DEFICIENCY, FRACTURES): Vit D, 25-Hydroxy: 18.3 ng/mL — ABNORMAL LOW (ref 30.0–100.0)

## 2023-12-24 LAB — HEMOGLOBIN A1C
Est. average glucose Bld gHb Est-mCnc: 120 mg/dL
Hgb A1c MFr Bld: 5.8 % — ABNORMAL HIGH (ref 4.8–5.6)

## 2023-12-24 MED ORDER — VITAMIN D (ERGOCALCIFEROL) 1.25 MG (50000 UNIT) PO CAPS: 50000.0000 [IU] | ORAL_CAPSULE | ORAL | 1 refills | Status: AC

## 2023-12-24 MED ORDER — ROSUVASTATIN CALCIUM 10 MG PO TABS: 10.0000 mg | ORAL_TABLET | Freq: Every day | ORAL | 3 refills | Status: DC

## 2023-12-24 NOTE — Assessment & Plan Note (Signed)
 Asymptomatic Pending PSA

## 2023-12-24 NOTE — Assessment & Plan Note (Addendum)
 The patient reports a reduction in alcohol consumption. He has been taking omeprazole 20 mg every other day with minimal relief of his symptoms. He was encouraged to increase the omeprazole dosage to 20 mg daily for improved symptom control.  Lifestyle and Dietary Recommendations: -Avoid Trigger Foods and Drinks: The patient is advised to limit or eliminate the following, which may exacerbate gastrointestinal symptoms: -Coffee, chocolate, onions, peppermint, spicy foods -Carbonated beverages, citrus fruits, tomatoes, garlic -Alcohol and fatty foods (e.g., bacon, burgers, sausages, steak, fried foods, dairy)  Recommended Foods: Encourage a diet rich in: -High-fiber options such as whole grain cereals, oatmeal, brown rice, root vegetables, and non-citrus fruits -High-protein foods and healthy fats including avocados, olive oil, nuts, and seeds

## 2023-12-24 NOTE — Assessment & Plan Note (Signed)
 Smokes about 1/2 pack/day  Asked about quitting: confirms that he currently smokes cigarettes Advise to quit smoking: Educated about QUITTING to reduce the risk of cancer, cardio and cerebrovascular disease. Assess willingness:  working on cutting back. Assist with counseling and pharmacotherapy: Counseled for 5 minutes and literature provided. Arrange for follow up: follow up in 3 months and continue to offer help.

## 2023-12-30 DIAGNOSIS — Z1211 Encounter for screening for malignant neoplasm of colon: Secondary | ICD-10-CM | POA: Diagnosis not present

## 2024-01-05 LAB — COLOGUARD: COLOGUARD: NEGATIVE

## 2024-05-03 ENCOUNTER — Ambulatory Visit: Admitting: Family Medicine

## 2024-05-04 ENCOUNTER — Encounter: Payer: Self-pay | Admitting: Family Medicine

## 2024-05-04 ENCOUNTER — Ambulatory Visit (HOSPITAL_COMMUNITY)
Admission: RE | Admit: 2024-05-04 | Discharge: 2024-05-04 | Disposition: A | Source: Ambulatory Visit | Attending: Family Medicine

## 2024-05-04 ENCOUNTER — Ambulatory Visit: Admitting: Family Medicine

## 2024-05-04 VITALS — BP 132/84 | HR 86 | Ht 69.0 in | Wt 230.1 lb

## 2024-05-04 DIAGNOSIS — R7301 Impaired fasting glucose: Secondary | ICD-10-CM

## 2024-05-04 DIAGNOSIS — E038 Other specified hypothyroidism: Secondary | ICD-10-CM

## 2024-05-04 DIAGNOSIS — K219 Gastro-esophageal reflux disease without esophagitis: Secondary | ICD-10-CM

## 2024-05-04 DIAGNOSIS — R0602 Shortness of breath: Secondary | ICD-10-CM | POA: Diagnosis not present

## 2024-05-04 DIAGNOSIS — Z8042 Family history of malignant neoplasm of prostate: Secondary | ICD-10-CM | POA: Diagnosis not present

## 2024-05-04 DIAGNOSIS — E559 Vitamin D deficiency, unspecified: Secondary | ICD-10-CM

## 2024-05-04 DIAGNOSIS — Z125 Encounter for screening for malignant neoplasm of prostate: Secondary | ICD-10-CM | POA: Diagnosis not present

## 2024-05-04 DIAGNOSIS — E782 Mixed hyperlipidemia: Secondary | ICD-10-CM

## 2024-05-04 MED ORDER — ALBUTEROL SULFATE HFA 108 (90 BASE) MCG/ACT IN AERS: 2.0000 | INHALATION_SPRAY | Freq: Four times a day (QID) | RESPIRATORY_TRACT | 2 refills | Status: AC | PRN

## 2024-05-04 NOTE — Patient Instructions (Addendum)
 I appreciate the opportunity to provide care to you today!    Follow up:  5 months  Labs: please stop by the lab today to get your blood drawn (CBC, CMP, TSH, Lipid profile, HgA1c, Vit D. PSA)  Shortness of breath with exertion -Please stop by [facility] to complete a chest X-ray. -A prescription for an albuterol  rescue inhaler has been sent to your pharmacy. Use every 6 hours as needed for relief of symptoms. -A referral has been placed to Pulmonology for pulmonary function testing to further assess your breathing and lung function.   For a Healthier YOU, I Recommend: Reducing your intake of sugar, sodium, carbohydrates, and saturated fats. Increasing your fiber intake by incorporating more whole grains, fruits, and vegetables into your meals. Setting healthy goals with a focus on lowering your consumption of carbs, sugar, and unhealthy fats. Adding variety to your diet by including a wide range of fruits and vegetables. Cutting back on soda and limiting processed foods as much as possible. Staying active: Aim for at least 150 minutes of moderate-intensity physical activity each week for optimal results.   Please follow up if your symptoms worsen or fail to improve.    Please continue to a heart-healthy diet and increase your physical activities. Try to exercise for at least five days a week.    It was a pleasure to see you and I look forward to continuing to work together on your health and well-being. Please do not hesitate to call the office if you need care or have questions about your care.  In case of emergency, please visit the Emergency Department for urgent care, or contact our clinic at [phone] to schedule an appointment. We're here to help you!   Have a wonderful day and week. With Gratitude, Meade JENEANE Gerlach MSN, FNP-BC, PMHNP-BC

## 2024-05-04 NOTE — Progress Notes (Signed)
 Established Patient Office Visit  Subjective:  Patient ID: Colin Black, male    DOB: Aug 08, 1975  Age: 48 y.o. MRN: [id number]  CC:  Chief Complaint  Patient presents with   Medical Management of Chronic Issues    4 month follow up     HPI Colin Black is a 48 y.o. male with past medical history of GERD, Back pain, Tobacco use presents for f/u of  chronic medical conditions.  The patient presents with shortness of breath with exertion. He has lost 13 pounds since his last visit but continues to experience dyspnea primarily during strenuous activities. He reports an occasional cough but denies wheezing or chest tightness. The patient is a current smoker, though he states he has reduced his intake to approximately half a pack per day. He reports smoking roughly 6-7 cigarettes daily, with a total smoking history of about 10-12 years.   No past medical history on file.  Past Surgical History:  Procedure Laterality Date   CYSTOSCOPY     remove scar tissue from urinary tract    Family History  Problem Relation Age of Onset   Hypertension Mother    Diabetes Father    Hypertension Maternal Grandmother    Diabetes Maternal Grandmother    Arthritis Maternal Grandfather    Prostate cancer Maternal Grandfather    Diabetes Maternal Uncle     Social History   Socioeconomic History   Marital status: Single    Spouse name: Not on file   Number of children: Not on file   Years of education: Not on file   Highest education level: Not on file  Occupational History   Not on file  Tobacco Use   Smoking status: Some Days    Types: Cigarettes   Smokeless tobacco: Not on file  Vaping Use   Vaping status: Never Used  Substance and Sexual Activity   Alcohol use: Yes    Comment: Occassionally.   Drug use: No   Sexual activity: Not Currently  Other Topics Concern   Not on file  Social History Narrative   Not on file   Social Drivers of Health   Financial Resource  Strain: Not on file  Food Insecurity: Not on file  Transportation Needs: Not on file  Physical Activity: Not on file  Stress: Not on file  Social Connections: Not on file  Intimate Partner Violence: Not on file    Outpatient Medications Prior to Visit  Medication Sig Dispense Refill   omeprazole (PRILOSEC OTC) 20 MG tablet Take 20 mg by mouth daily as needed (For heartburn).      rosuvastatin  (CRESTOR ) 10 MG tablet Take 1 tablet (10 mg total) by mouth daily. 90 tablet 3   Vitamin D , Ergocalciferol , (DRISDOL ) 1.25 MG (50000 UNIT) CAPS capsule Take 1 capsule (50,000 Units total) by mouth every 7 (seven) days. 27 capsule 1   amoxicillin -clavulanate (AUGMENTIN ) 875-125 MG tablet Take 1 tablet by mouth every 12 (twelve) hours. (Patient not taking: Reported on 05/04/2024) 14 tablet 0   cyclobenzaprine  (FLEXERIL ) 5 MG tablet Take 1-2 tablets (5-10 mg total) by mouth 3 (three) times daily as needed for muscle spasms. (Patient not taking: Reported on 05/04/2024) 20 tablet 0   etodolac  (LODINE ) 500 MG tablet Take 1 tablet (500 mg total) by mouth 2 (two) times daily. (Patient not taking: Reported on 05/04/2024) 30 tablet 0   ondansetron  (ZOFRAN -ODT) 8 MG disintegrating tablet Take 1 tablet (8 mg total) by mouth every 8 (eight)  hours as needed for nausea or vomiting. (Patient not taking: Reported on 05/04/2024) 10 tablet 0   No facility-administered medications prior to visit.    No Known Allergies  ROS Review of Systems  Constitutional:  Negative for fatigue and fever.  Eyes:  Negative for visual disturbance.  Respiratory:  Positive for cough and shortness of breath. Negative for chest tightness and wheezing.   Cardiovascular:  Negative for chest pain and palpitations.  Neurological:  Negative for dizziness and headaches.      Objective:    Physical Exam HENT:     Head: Normocephalic.     Right Ear: External ear normal.     Left Ear: External ear normal.     Nose: No congestion or  rhinorrhea.     Mouth/Throat:     Mouth: Mucous membranes are moist.  Cardiovascular:     Rate and Rhythm: Regular rhythm.     Heart sounds: No murmur heard. Pulmonary:     Effort: No respiratory distress.     Breath sounds: Normal breath sounds.  Neurological:     Mental Status: He is alert.     BP 132/84   Pulse 86   Ht 5' 9 (1.753 m)   Wt 230 lb 1.9 oz (104.4 kg)   SpO2 95%   BMI 33.98 kg/m  Wt Readings from Last 3 Encounters:  05/04/24 230 lb 1.9 oz (104.4 kg)  12/23/23 243 lb 1.9 oz (110.3 kg)  05/08/23 235 lb 0.2 oz (106.6 kg)    Lab Results  Component Value Date   TSH 0.816 12/23/2023   Lab Results  Component Value Date   WBC 7.6 12/23/2023   HGB 16.3 12/23/2023   HCT 48.3 12/23/2023   MCV 98 (H) 12/23/2023   PLT 241 12/23/2023   Lab Results  Component Value Date   NA 138 12/23/2023   K 4.7 12/23/2023   CO2 21 12/23/2023   GLUCOSE 150 (H) 12/23/2023   BUN 8 12/23/2023   CREATININE 0.84 12/23/2023   BILITOT 0.3 12/23/2023   ALKPHOS 67 12/23/2023   AST 27 12/23/2023   ALT 33 12/23/2023   PROT 7.4 12/23/2023   ALBUMIN 4.4 12/23/2023   CALCIUM  9.7 12/23/2023   ANIONGAP 7 11/17/2014   EGFR 107 12/23/2023   Lab Results  Component Value Date   CHOL 111 12/23/2023   Lab Results  Component Value Date   HDL 41 12/23/2023   Lab Results  Component Value Date   LDLCALC 6 12/23/2023   Lab Results  Component Value Date   TRIG 498 (H) 12/23/2023   Lab Results  Component Value Date   CHOLHDL 2.7 12/23/2023   Lab Results  Component Value Date   HGBA1C 5.8 (H) 12/23/2023      Assessment & Plan:  SOB (shortness of breath) on exertion Assessment & Plan: -Encouraged show stop by [facility] to complete a chest X-ray. -A prescription for an albuterol  rescue inhaler has been sent to his pharmacy. Use every 6 hours as needed for relief of symptoms. -A referral has been placed to Pulmonology for pulmonary function testing to further assess  his breathing and lung function.  Orders: -     Pulmonary Visit -     DG Chest 2 View -     Albuterol  Sulfate HFA; Inhale 2 puffs into the lungs every 6 (six) hours as needed for wheezing or shortness of breath.  Dispense: 8 g; Refill: 2  Gastroesophageal reflux disease without esophagitis  Assessment & Plan: GERD diet encouraged   Family history of malignant neoplasm of prostate Assessment & Plan: Pending labs asymptomatic  Orders: -     PSA, total and free  Prostate cancer screening -     PSA, total and free  IFG (impaired fasting glucose) -     Hemoglobin A1c  Vitamin D  deficiency -     VITAMIN D  25 Hydroxy (Vit-D Deficiency, Fractures)  TSH (thyroid-stimulating hormone deficiency) -     TSH + free T4  Mixed hyperlipidemia -     Lipid panel -     CMP14+EGFR -     CBC with Differential/Platelet  Note: This chart has been completed using [employer] (consulting) software, and while attempts have been made to ensure accuracy, certain words and phrases may not be transcribed as intended.    Follow-up: Return in about 5 months (around 10/02/2024).   Lajoy Vanamburg  Z Bacchus, FNP

## 2024-05-04 NOTE — Assessment & Plan Note (Signed)
 Pending labs asymptomatic

## 2024-05-04 NOTE — Assessment & Plan Note (Signed)
 GERD diet encouraged

## 2024-05-04 NOTE — Assessment & Plan Note (Signed)
-  Encouraged show stop by [facility] to complete a chest X-ray. -A prescription for an albuterol  rescue inhaler has been sent to his pharmacy. Use every 6 hours as needed for relief of symptoms. -A referral has been placed to Pulmonology for pulmonary function testing to further assess his breathing and lung function.

## 2024-05-05 LAB — CMP14+EGFR
ALT: 23 IU/L (ref 0–44)
AST: 24 IU/L (ref 0–40)
Albumin: 4.7 g/dL (ref 4.1–5.1)
Alkaline Phosphatase: 68 IU/L (ref 47–123)
BUN/Creatinine Ratio: 11 (ref 9–20)
BUN: 10 mg/dL (ref 6–24)
Bilirubin Total: 1 mg/dL (ref 0.0–1.2)
CO2: 21 mmol/L (ref 20–29)
Calcium: 9.8 mg/dL (ref 8.7–10.2)
Chloride: 101 mmol/L (ref 96–106)
Creatinine, Ser: 0.91 mg/dL (ref 0.76–1.27)
Globulin, Total: 2.8 g/dL (ref 1.5–4.5)
Glucose: 112 mg/dL — ABNORMAL HIGH (ref 70–99)
Potassium: 4.8 mmol/L (ref 3.5–5.2)
Sodium: 138 mmol/L (ref 134–144)
Total Protein: 7.5 g/dL (ref 6.0–8.5)
eGFR: 103 mL/min/1.73 (ref >59)

## 2024-05-05 LAB — PSA, TOTAL AND FREE
PSA, Free Pct: 40 %
PSA, Free: 0.28 ng/mL
Prostate Specific Ag, Serum: 0.7 ng/mL (ref 0.0–4.0)

## 2024-05-05 LAB — LIPID PANEL
Chol/HDL Ratio: 2.1 ratio (ref 0.0–5.0)
Cholesterol, Total: 85 mg/dL — ABNORMAL LOW (ref 100–199)
HDL: 40 mg/dL (ref >39)
Triglycerides: 343 mg/dL — ABNORMAL HIGH (ref 0–149)

## 2024-05-05 LAB — CBC WITH DIFFERENTIAL/PLATELET
Basophils Absolute: 0.1 x10E3/uL (ref 0.0–0.2)
Basos: 1 %
EOS (ABSOLUTE): 0.3 x10E3/uL (ref 0.0–0.4)
Eos: 4 %
Hematocrit: 46.6 % (ref 37.5–51.0)
Hemoglobin: 15.9 g/dL (ref 13.0–17.7)
Immature Grans (Abs): 0 x10E3/uL (ref 0.0–0.1)
Immature Granulocytes: 0 %
Lymphocytes Absolute: 2.2 x10E3/uL (ref 0.7–3.1)
Lymphs: 32 %
MCH: 33.4 pg — ABNORMAL HIGH (ref 26.6–33.0)
MCHC: 34.1 g/dL (ref 31.5–35.7)
MCV: 98 fL — ABNORMAL HIGH (ref 79–97)
Monocytes Absolute: 0.5 x10E3/uL (ref 0.1–0.9)
Monocytes: 7 %
Neutrophils Absolute: 3.7 x10E3/uL (ref 1.4–7.0)
Neutrophils: 56 %
Platelets: 240 x10E3/uL (ref 150–450)
RBC: 4.76 x10E6/uL (ref 4.14–5.80)
RDW: 12 % (ref 11.6–15.4)
WBC: 6.8 x10E3/uL (ref 3.4–10.8)

## 2024-05-05 LAB — HEMOGLOBIN A1C
Est. average glucose Bld gHb Est-mCnc: 100 mg/dL
Hgb A1c MFr Bld: 5.1 % (ref 4.8–5.6)

## 2024-05-05 LAB — TSH+FREE T4
Free T4: 1.47 ng/dL (ref 0.82–1.77)
TSH: 0.798 u[IU]/mL (ref 0.450–4.500)

## 2024-05-05 LAB — VITAMIN D 25 HYDROXY (VIT D DEFICIENCY, FRACTURES): Vit D, 25-Hydroxy: 53.3 ng/mL (ref 30.0–100.0)

## 2024-05-06 ENCOUNTER — Other Ambulatory Visit: Payer: Self-pay | Admitting: Family Medicine

## 2024-05-06 ENCOUNTER — Ambulatory Visit: Payer: Self-pay | Admitting: Family Medicine

## 2024-05-06 DIAGNOSIS — E782 Mixed hyperlipidemia: Secondary | ICD-10-CM

## 2024-05-06 MED ORDER — ROSUVASTATIN CALCIUM 20 MG PO TABS: 20.0000 mg | ORAL_TABLET | Freq: Every day | ORAL | 3 refills | Status: AC

## 2024-05-06 NOTE — Progress Notes (Signed)
 Please inform the patient that a prescription for rosuvastatin  20 mg daily has been sent to the pharmacy because his triglyceride levels are elevated. In addition to medication adherence, I recommend decreasing his intake of greasy, fatty, and starchy foods and increasing physical activity as tolerated.   All other labs are stable.
